# Patient Record
Sex: Female | Born: 1985 | Race: Black or African American | Hispanic: No | Marital: Single | Smoking: Never smoker
Health system: Southern US, Community
[De-identification: ages and names within clinical notes are randomized; demographics above are authoritative.]

## PROBLEM LIST (undated history)

## (undated) DIAGNOSIS — D649 Anemia, unspecified: Secondary | ICD-10-CM

## (undated) DIAGNOSIS — I10 Essential (primary) hypertension: Secondary | ICD-10-CM

## (undated) HISTORY — PX: WISDOM TOOTH EXTRACTION: SHX21

## (undated) HISTORY — DX: Anemia, unspecified: D64.9

## (undated) HISTORY — DX: Essential (primary) hypertension: I10

---
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
## 2006-08-17 ENCOUNTER — Emergency Department (HOSPITAL_COMMUNITY): Admission: EM | Admit: 2006-08-17 | Discharge: 2006-08-17 | Payer: Self-pay | Admitting: Emergency Medicine

## 2006-10-30 ENCOUNTER — Ambulatory Visit: Payer: Self-pay | Admitting: Family Medicine

## 2006-11-21 ENCOUNTER — Other Ambulatory Visit: Admission: RE | Admit: 2006-11-21 | Discharge: 2006-11-21 | Payer: Self-pay | Admitting: Obstetrics and Gynecology

## 2007-01-04 ENCOUNTER — Ambulatory Visit (HOSPITAL_COMMUNITY): Admission: RE | Admit: 2007-01-04 | Discharge: 2007-01-04 | Payer: Self-pay | Admitting: Obstetrics and Gynecology

## 2007-02-18 ENCOUNTER — Inpatient Hospital Stay (HOSPITAL_COMMUNITY): Admission: AD | Admit: 2007-02-18 | Discharge: 2007-02-18 | Payer: Self-pay | Admitting: Obstetrics and Gynecology

## 2007-04-25 ENCOUNTER — Inpatient Hospital Stay (HOSPITAL_COMMUNITY): Admission: AD | Admit: 2007-04-25 | Discharge: 2007-04-26 | Payer: Self-pay | Admitting: Obstetrics and Gynecology

## 2007-04-25 ENCOUNTER — Encounter: Payer: Self-pay | Admitting: Obstetrics and Gynecology

## 2007-05-12 ENCOUNTER — Inpatient Hospital Stay (HOSPITAL_COMMUNITY): Admission: AD | Admit: 2007-05-12 | Discharge: 2007-05-15 | Payer: Self-pay | Admitting: Obstetrics & Gynecology

## 2007-05-21 ENCOUNTER — Inpatient Hospital Stay (HOSPITAL_COMMUNITY): Admission: AD | Admit: 2007-05-21 | Discharge: 2007-05-23 | Payer: Self-pay | Admitting: Obstetrics and Gynecology

## 2007-06-24 ENCOUNTER — Other Ambulatory Visit: Admission: RE | Admit: 2007-06-24 | Discharge: 2007-06-24 | Payer: Self-pay | Admitting: Obstetrics and Gynecology

## 2007-10-17 ENCOUNTER — Emergency Department (HOSPITAL_COMMUNITY): Admission: EM | Admit: 2007-10-17 | Discharge: 2007-10-17 | Payer: Self-pay | Admitting: Emergency Medicine

## 2008-05-21 ENCOUNTER — Ambulatory Visit (HOSPITAL_COMMUNITY): Admission: RE | Admit: 2008-05-21 | Discharge: 2008-05-21 | Payer: Self-pay | Admitting: Family Medicine

## 2008-05-21 ENCOUNTER — Encounter: Payer: Self-pay | Admitting: Obstetrics and Gynecology

## 2008-05-21 ENCOUNTER — Ambulatory Visit: Payer: Self-pay | Admitting: Obstetrics and Gynecology

## 2009-03-31 ENCOUNTER — Emergency Department (HOSPITAL_COMMUNITY): Admission: EM | Admit: 2009-03-31 | Discharge: 2009-03-31 | Payer: Self-pay | Admitting: Family Medicine

## 2010-09-26 ENCOUNTER — Encounter (INDEPENDENT_AMBULATORY_CARE_PROVIDER_SITE_OTHER): Payer: Self-pay | Admitting: *Deleted

## 2010-09-26 ENCOUNTER — Ambulatory Visit: Payer: Self-pay | Admitting: Obstetrics and Gynecology

## 2010-09-26 LAB — CONVERTED CEMR LAB: Hepatitis B Surface Ag: NEGATIVE

## 2011-03-28 NOTE — H&P (Signed)
Sims, Molly              ACCOUNT NO.:  [id number]   MEDICAL RECORD NO.:  [id number]          PATIENT TYPE:  INP   LOCATION:  9399                          FACILITY:  [facility]   PHYSICIAN:  James A. Ashley Royalty, M.D.DATE OF BIRTH:  Jun 30, 1986   DATE OF ADMISSION:  05/21/2007  DATE OF DISCHARGE:                              HISTORY & PHYSICAL   HISTORY OF PRESENT ILLNESS:  This is a 25 year old gravida 1, para 1 who  delivered 05/13/07, vaginally, 8 days ago. She was evaluated by the baby  love  coordinator this morning and found to be slightly hypertensive  and febrile. She was asked to come into the office today for evaluation.  Of note, regarding the patient's  delivery is the fact that she  experienced premature rupture of the membranes at 10:00 p.m. the night  before she came in. She waited 14 hours to present to the hospital after  this event even though she had been given written instructions to call  immediately should she experience what she thought might be ruptured  membranes. On discharge she was also given explicit instructions to call  for any temperature elevations orally of 100.4 degrees Fahrenheit. Upon  arrival today she stated she had a temperature of 106 degrees Fahrenheit  last evening and did not call. In addition, she had a temperature of 104  degrees Fahrenheit this morning and did not call. She states her  bleeding is diminishing appropriately. She denies myalgias, arthralgias,  right upper quadrant or epigastric pain, visual disturbances, calf  tenderness, dysuria or sputum production. She does admit to having some  headaches and occasional dizziness as well.   MEDICATIONS:  1. Ibuprofen.  2. Percocet.  3. Chromagen.   PAST MEDICAL HISTORY:  Negative.   PAST SURGICAL HISTORY:  Negative.   ALLERGIES:  NONE.   FAMILY HISTORY:  Positive for hypertension, diabetes.   SOCIAL HISTORY:  The patient denies use of tobacco or extensive alcohol.   REVIEW OF  SYSTEMS:  Noncontributory.   PHYSICAL EXAMINATION:  GENERAL APPEARANCE: Well-developed, well-  nourished, obese, black female in no acute distress.  VITAL SIGNS: Temperature of 102.9 degrees Fahrenheit, blood pressure  140/88.  SKIN: The skin is warm and dry without lesions.  LYMPH NODES: There is no supraclavicular, cervical or inguinal  adenopathy.  HEENT: Normocephalic.  NECK: Supple without thyromegaly.  CHEST: Lungs are clear.  CARDIAC: Regular rate and rhythm.  BREASTS: The breasts are soft and without palpable mass or adenopathy  bilaterally.  ABDOMEN: The abdomen is soft and only minimally tender to frank  palpation. Bowel sounds are active.  MUSCULOSKELETAL: No CVA tenderness.  PELVIC: External genitalia within normal limits. Vagina and cervix  without gross lesions. Bimanual examination reveals the uterus to be  approximately 12 to 14 weeks' size and modest tenderness to direct  palpation noted. I could appreciate no adnexal masses.   IMPRESSION:  1. Status post vaginal delivery 05/13/07, eight days ago.  2. Noncompliance with obstetrical gynecological care.  3. Modest blood pressure elevation and headache -- rule out post      partum pre-eclampsia.  4. Fever -- differential includes endometritis. Doubt thromboembolic      phenomenon, pneumonitis, urinary tract infection, viral syndrome.   PLAN:  Will place on outpatient observation. Will check labs including  PIH panel, blood cultures, urine culture, urinalysis. After cultures  obtained will initiate IV antibiotics and other appropriate  interventions as indicated.      James A. Ashley Royalty, M.D.  Electronically Signed     JAM/MEDQ  D:  05/21/2007  T:  05/21/2007  Job:  [id number]

## 2011-03-28 NOTE — Group Therapy Note (Signed)
NAMERACQUELLE, HYSER NO.:  [id number]   MEDICAL RECORD NO.:  [id number]          PATIENT TYPE:  WOC   LOCATION:  [facility]                   FACILITY:  [facility]   PHYSICIAN:  Allie Bossier, MD        DATE OF BIRTH:  09-02-1986   DATE OF SERVICE:  05/21/2008                                  CLINIC NOTE   REASON FOR VISIT:  Abdominal pain and annual exam, and to evaluate  placement of IUD.   SUBJECTIVE:  Molly Sims is a 25 year old female who had a child  approximately one year ago and her last annual exam approximately one  year ago, who presents for a repeat of her annual exam,  and to evaluate  the placement of a Mirena.  She has been complaining of some abdominal  pain intermittently for the last several months, which she describes as  dull and periodic in her lower abdomen.  It is worse with having  intercourse.  She has not been checking placement of her IUD, and was  unsure how to do so.  She is unsure of her last period as she has only  spotted occasionally when having her IUD.  The patient started her  menstrual period at age 35 and is a G1, P1-0-0-1, and has a son.  She  had a Mirena placed for contraception and takes no other medicines other  than that.  She has no allergies either.  Her last PAP smear was  approximately one year ago.  She has never had an abnormal Pap smear.   FAMILY HISTORY:  Patient's grandmother, grandfather and aunts all have  diabetes.  Her grandmother also has high blood pressure.   SOCIAL HISTORY:  She does work at [employer].  She does  not smoke or drink alcohol.  She is currently married and her spouse is  with her today in this visit.   OBJECTIVE:  Temperature is 98.7, pulse 72, blood pressure 110/70, weight  275.8 pounds.  Respiratory rate is 20.  GENERAL:  This is a morbidly obese African American female in no  apparent distress, who is pleasant during her exam.  CARDIOVASCULAR:  Reveals a regular rate and  rhythm.  No murmurs, rubs or  gallops.  LUNGS:  Clear to auscultation bilaterally.  ABDOMEN:  Mildly tender to deep palpation in the suprapubic region.  Otherwise it is nontender and there is no hepatosplenomegaly  appreciated.  BREAST:  Exam normal bilaterally.  GU:  Exam reveals a normal physiologic vaginal discharge.  Normal  rugated vagina.  Cervix without any appreciable lesions on  visualization.  Pap smear was performed during this exam.  Bimanual exam  reveals a small palpable cyst at approximately 7 o'clock on the cervix,  which is nontender.  She has no cervical motion tenderness and there are  no adnexal masses appreciated.  IUD strings were not visualized nor  palpated during the exam.   ASSESSMENT:  This is a 25 year old female who presents for her annual  exam with some abdominal pain and concern about placement of her IUD.   PLAN:  1. Abdominal pain.  It is possible that this is related to her IUD.      Therefore, we will get an ultrasound to evaluate location of her      IUD.  She plans to use condoms at this point in time to protect      against pregnancy since her IUD was not able to be visualized.  We      do suspect that her IUD is likely in place.  2. Preventive care and health maintenance.  The patient did have her      annual Pap smear performed today and breast exam.  We will make      sure that she gets the results of this Pap smear once they return.     ______________________________  Ancil Boozer, ?    ______________________________  Allie Bossier, MD    SA/MEDQ  D:  05/21/2008  T:  05/21/2008  Job:  [id number]

## 2011-03-31 NOTE — Discharge Summary (Signed)
Molly Sims, Molly Sims              ACCOUNT NO.:  [id number]   MEDICAL RECORD NO.:  [id number]          PATIENT TYPE:  INP   LOCATION:  9118                          [facility]   PHYSICIAN:  James A. Ashley Royalty, M.D.DATE OF BIRTH:  [birth year]   DATE OF ADMISSION:  05/12/2007  DATE OF DISCHARGE:  05/15/2007                               DISCHARGE SUMMARY   DISCHARGE DIAGNOSES:  1. Intrauterine pregnancy at 37 weeks and 5 days gestation, delivered.  2. Premature rupture of membranes.  3. GBS carrier.  4. Noncompliance with OB/GYN care.  5. Term birth living child  6. Shoulder dystocia.  7. Anemia - asymptomatic.   CONSULTATIONS:  None.   DISCHARGE MEDICATIONS:  Percocet, Motrin 600 mg q.i.d. p.r.n.,  Chromagen, prenatal vitamins.   HISTORY AND PHYSICAL:  A 25 year old primigravida at 37 weeks and 5 days  gestation.  Prenatal care was essentially uncomplicated.  The patient  presented to maternity admissions complaining of ruptured membranes on  May 11, 2007.  She did not call.  Rupture of membranes was documented  in maternity admissions.  The cervical examination in maternity  admissions revealed 2-cm dilatation. For the remainder of the history  and physical please see chart.   HOSPITAL COURSE:  The patient was admitted to [facility] of  [place].  Initial laboratory studies were drawn.  She was given  antibiotic prophylaxis versus GBS.  She was given Pitocin intravenously.  The patient went on to labor and deliver on May 13, 2007.  The infant  was a 5 pound 14 ounce female, Apgar's 6 at 1 minute and 8 at 5 minutes,  sent to the newborn nursery.  Delivery was accomplished by Dr. Sylvester Harder over an intact perineum with the kiwi vacuum extractor  secondary to variable decelerations during delayed second stage.  There  was a shoulder dystocia which was relieved with McRoberts and suprapubic  pressure.  Delivery was accomplished over an intact perineum.  However,  with the emergence of the shoulder dystocia a second-degree midline  episiotomy was cut and later repaired without difficulty.   The patient's postpartum course was benign save for an asymptomatic  anemia.  The patient was felt to be stable for discharge on May 15, 2007  and was discharged home afebrile and in satisfactory condition.   DISPOSITION:  The patient is to return to [facility] and  Obstetrics in 6 weeks for postpartum evaluation.      James A. Ashley Royalty, M.D.  Electronically Signed     JAM/MEDQ  D:  07/18/2007  T:  07/18/2007  Job:  [id number]

## 2011-03-31 NOTE — Discharge Summary (Signed)
Molly Sims, Molly Sims              ACCOUNT NO.:  [id number]   MEDICAL RECORD NO.:  [id number]          PATIENT TYPE:  INP   LOCATION:  9315                          [facility]   PHYSICIAN:  James A. Ashley Royalty, M.D.DATE OF BIRTH:  05-27-1986   DATE OF ADMISSION:  05/21/2007  DATE OF DISCHARGE:  05/23/2007                               DISCHARGE SUMMARY   DISCHARGE DIAGNOSES:  1. Status post vaginal delivery May 13, 2007.  2. Noncompliance with obstetric/gynecologic care.  3. Postpartum endomyometritis--improved.   CONSULTATIONS:  None.   DISCHARGE MEDICATIONS:  1. Augmentin 875 mg b.i.d.  2. Other medications as previously prescribed at the time of the last      discharge.   HISTORY AND PHYSICAL:  This is a 25 year old gravida 1, para 1, status  post vaginal delivery May 13, 2007.  She was evaluated at home by the  [facility] on the morning of admission and found to be  slightly hypertensive and febrile.  She was asked to come to the office  for evaluation.  The patient's temperature was 102.9 degrees Fahrenheit,  blood pressure 140/88.  There was modest tenderness to direct palpation  of the uterus.  The initial impression was fever--differential including  endometritis, and so forth.  The patient was admitted for further workup  and antibiotics.  For the remainder of the history and physical, please  see chart.   HOSPITAL COURSE:  The patient was admitted to [facility] of  [place].  Admission laboratory studies were drawn.  Urine and blood  cultures were obtained as well as a PIH panel.  WBC revealed a value of  17.1 initially.  After IV antibiotics were instituted, it went down to  13.2 by May 22, 2007.  By May 23, 2007, the white blood count went down  farther to 9000.  The patient was afebrile for greater than 24 hours.  On May 23, 2007, she was felt to be stable for discharge with  presumptive diagnosis of endomyometritis-resolving.  She was  discharged  home on Augmentin 875 mg b.i.d. as well as the medications previously  given on discharge after her vaginal delivery.   DISPOSITION:  The patient is to return to the office in approximately  five weeks for postpartum appointment.  Appropriate discharge  instructions were given.      James A. Ashley Royalty, M.D.  Electronically Signed     JAM/MEDQ  D:  07/18/2007  T:  07/18/2007  Job:  [id number]

## 2011-06-03 ENCOUNTER — Emergency Department (HOSPITAL_COMMUNITY)
Admission: EM | Admit: 2011-06-03 | Discharge: 2011-06-03 | Disposition: A | Discharge status: Home / Self Care | Payer: Medicaid Other | Attending: Emergency Medicine | Admitting: Emergency Medicine

## 2011-06-03 DIAGNOSIS — S51809A Unspecified open wound of unspecified forearm, initial encounter: Secondary | ICD-10-CM | POA: Insufficient documentation

## 2011-06-03 DIAGNOSIS — S01501A Unspecified open wound of lip, initial encounter: Secondary | ICD-10-CM | POA: Insufficient documentation

## 2011-06-03 DIAGNOSIS — Y92009 Unspecified place in unspecified non-institutional (private) residence as the place of occurrence of the external cause: Secondary | ICD-10-CM | POA: Insufficient documentation

## 2011-06-03 DIAGNOSIS — S61209A Unspecified open wound of unspecified finger without damage to nail, initial encounter: Secondary | ICD-10-CM | POA: Insufficient documentation

## 2011-06-06 ENCOUNTER — Inpatient Hospital Stay (INDEPENDENT_AMBULATORY_CARE_PROVIDER_SITE_OTHER)
Admission: RE | Admit: 2011-06-06 | Discharge: 2011-06-06 | Disposition: A | Discharge status: Home / Self Care | Payer: Medicaid Other | Source: Ambulatory Visit | Attending: Emergency Medicine | Admitting: Emergency Medicine

## 2011-06-06 DIAGNOSIS — S01501A Unspecified open wound of lip, initial encounter: Secondary | ICD-10-CM

## 2011-06-10 ENCOUNTER — Inpatient Hospital Stay (INDEPENDENT_AMBULATORY_CARE_PROVIDER_SITE_OTHER)
Admission: RE | Admit: 2011-06-10 | Discharge: 2011-06-10 | Disposition: A | Discharge status: Home / Self Care | Payer: Medicaid Other | Source: Ambulatory Visit | Attending: Family Medicine | Admitting: Family Medicine

## 2011-06-10 DIAGNOSIS — Z4802 Encounter for removal of sutures: Secondary | ICD-10-CM

## 2011-08-29 LAB — CBC
HCT: 32.1 — ABNORMAL LOW
Hemoglobin: 10.6 — ABNORMAL LOW
MCHC: 32.6
MCHC: 32.6
MCHC: 32.8
MCV: 87.4
MCV: 88.3
Platelets: 273
Platelets: 340
Platelets: 361
RBC: 3.26 — ABNORMAL LOW
RBC: 3.66 — ABNORMAL LOW
RBC: 3.73 — ABNORMAL LOW
RDW: 14.4 — ABNORMAL HIGH
RDW: 14.7 — ABNORMAL HIGH
RDW: 14.7 — ABNORMAL HIGH
WBC: 13.2 — ABNORMAL HIGH
WBC: 17.1 — ABNORMAL HIGH

## 2011-08-29 LAB — URINE CULTURE: Special Requests: NEGATIVE

## 2011-08-29 LAB — URINALYSIS, ROUTINE W REFLEX MICROSCOPIC
Bilirubin Urine: NEGATIVE
Glucose, UA: NEGATIVE
Ketones, ur: 15 — AB
Protein, ur: NEGATIVE
Urobilinogen, UA: 0.2

## 2011-08-29 LAB — COMPREHENSIVE METABOLIC PANEL
Albumin: 2.8 — ABNORMAL LOW
BUN: 7
CO2: 23
GFR calc non Af Amer: >60
Glucose, Bld: 125 — ABNORMAL HIGH
Sodium: 135
Total Bilirubin: 0.4

## 2011-08-29 LAB — CULTURE, BLOOD (ROUTINE X 2)

## 2011-08-29 LAB — URIC ACID: Uric Acid, Serum: 6.4

## 2011-08-29 LAB — URINE MICROSCOPIC-ADD ON

## 2011-08-30 LAB — CBC
Hemoglobin: 11.1 — ABNORMAL LOW
MCHC: 33.1
MCV: 86.3
Platelets: 341
WBC: 13.6 — ABNORMAL HIGH

## 2011-08-30 LAB — RPR: RPR Ser Ql: NONREACTIVE

## 2011-08-31 LAB — DIFFERENTIAL
Eosinophils Absolute: 0.1
Lymphocytes Relative: 25
Neutro Abs: 8.3 — ABNORMAL HIGH
Neutrophils Relative %: 67

## 2011-08-31 LAB — I-STAT 8, (EC8 V) (CONVERTED LAB)
Acid-base deficit: 4 — ABNORMAL HIGH
BUN: 4 — ABNORMAL LOW
Bicarbonate: 19.9 — ABNORMAL LOW
Chloride: 110
HCT: 37
Hemoglobin: 12.6
Operator id: 146091
Sodium: 140

## 2011-08-31 LAB — POCT I-STAT CREATININE
Creatinine, Ser: 0.6
Operator id: 146091

## 2011-08-31 LAB — CBC
MCV: 87.1
RDW: 14.5 — ABNORMAL HIGH

## 2011-08-31 LAB — KLEIHAUER-BETKE STAIN: Fetal Cells %: 0

## 2012-01-10 ENCOUNTER — Encounter (HOSPITAL_COMMUNITY): Payer: Self-pay | Admitting: Emergency Medicine

## 2012-01-10 ENCOUNTER — Emergency Department (HOSPITAL_COMMUNITY)
Admission: EM | Admit: 2012-01-10 | Discharge: 2012-01-11 | Disposition: A | Discharge status: Home / Self Care | Payer: Medicaid Other | Attending: Emergency Medicine | Admitting: Emergency Medicine

## 2012-01-10 DIAGNOSIS — M25469 Effusion, unspecified knee: Secondary | ICD-10-CM | POA: Insufficient documentation

## 2012-01-10 DIAGNOSIS — L02419 Cutaneous abscess of limb, unspecified: Secondary | ICD-10-CM | POA: Insufficient documentation

## 2012-01-10 DIAGNOSIS — L03119 Cellulitis of unspecified part of limb: Secondary | ICD-10-CM

## 2012-01-10 DIAGNOSIS — M7989 Other specified soft tissue disorders: Secondary | ICD-10-CM | POA: Insufficient documentation

## 2012-01-10 DIAGNOSIS — M79609 Pain in unspecified limb: Secondary | ICD-10-CM | POA: Insufficient documentation

## 2012-01-10 MED ORDER — OXYCODONE-ACETAMINOPHEN 5-325 MG PO TABS
1.0000 | ORAL_TABLET | Freq: Once | ORAL | Status: AC
  Administered 2012-01-11: 1 via ORAL
  Filled 2012-01-10: qty 1

## 2012-01-10 NOTE — ED Notes (Signed)
Patient with left leg pain from knee down.  Patient denies any injury to the leg.  She has pain when walking or bending the knee.

## 2012-01-10 NOTE — ED Provider Notes (Signed)
History     CSN: [id number]  Arrival date & time 01/10/12  2016   First MD Initiated Contact with Patient 01/10/12 2337      Chief Complaint  Patient presents with  . Leg Pain    (Consider location/radiation/quality/duration/timing/severity/associated sxs/prior treatment) HPI Comments: 2 days ago, while walking patient developed left anterior medial calf pain with swelling without injury.  It has been persistent since and very painful for ambulation.  There is no discoloration of the skin.  She does shave her legs, but there are no abrasions or breaks in the skin.  This and has not had fever, myalgias  Patient is a 26 y.o. female presenting with leg pain.  Leg Pain  The incident occurred 2 days ago. The incident occurred at home. There was no injury mechanism.    History reviewed. No pertinent past medical history.  History reviewed. No pertinent past surgical history.  History reviewed. No pertinent family history.  History  Substance Use Topics  . Smoking status: Never Smoker   . Smokeless tobacco: Not on file  . Alcohol Use: Yes    OB History    Grav Para Term Preterm Abortions TAB SAB Ect Mult Living                  Review of Systems  Constitutional: Negative for fever.  Respiratory: Negative for shortness of breath.   Cardiovascular: Positive for leg swelling.  Skin: Negative for rash and wound.  Neurological: Negative for dizziness.    Allergies  Review of patient's allergies indicates no known allergies.  Home Medications   Current Outpatient Rx  Name Route Sig Dispense Refill  . CEPHALEXIN 250 MG PO CAPS Oral Take 1 capsule (250 mg total) by mouth 4 (four) times daily. 28 capsule 0  . DOXYCYCLINE HYCLATE 100 MG PO CAPS Oral Take 1 capsule (100 mg total) by mouth 2 (two) times daily. 20 capsule 0  . HYDROCODONE-ACETAMINOPHEN 5-325 MG PO TABS Oral Take 2 tablets by mouth every 6 (six) hours as needed for pain. 30 tablet 0    BP 135/90  Pulse 103   Temp(Src) 98.8 F (37.1 C) (Oral)  Resp 18  Ht 5\' 2"  (1.575 m)  Wt 270 lb (122.471 kg)  BMI 49.38 kg/m2  SpO2 100%  LMP 12/24/2011  Physical Exam  Constitutional: She is oriented to person, place, and time. She appears well-developed and well-nourished.  HENT:  Head: Normocephalic.  Eyes: Pupils are equal, round, and reactive to light.  Neck: Normal range of motion.  Cardiovascular: Normal rate.   Pulmonary/Chest: Effort normal.  Musculoskeletal: She exhibits edema and tenderness.       Large area of greater than 10 cm, oval, medial left calf, just distal to the knee that is slightly fluctuant, not discolored, extremely painful.  Vision has pain in the joint with movement pain with ambulation  Neurological: She is oriented to person, place, and time.  Skin: Skin is warm and dry. No rash noted. No erythema.    ED Course  Procedures (including critical care time)  Labs Reviewed  CBC - Abnormal; Notable for the following:    WBC 11.3 (*)    Hemoglobin 11.4 (*)    HCT 35.6 (*)    All other components within normal limits  DIFFERENTIAL   Ct Tibia Fibula Left W Contrast  01/11/2012  *RADIOLOGY REPORT*  Clinical Data: Left leg pain, extending inferiorly from the knee. Concern for soft tissue mass along the left lower  leg.  Pain when ambulating or flexing the knee.  CT OF THE LEFT TIBIA AND FIBULA WITH CONTRAST  Contrast: OMNIPAQUE IOHEXOL 300 MG/ML IJ SOLN  Comparison: Left lower extremity ultrasound performed earlier today at 01:31 a.m.  Findings: No focal soft tissue mass is identified.  No significant soft tissue abnormality is seen in the area of clinical concern. Minimal soft tissue edema is noted along the lateral aspect of the left lower leg, and anterior to the tibia.  The visualized soft tissues are otherwise grossly unremarkable.  The visualized musculature is within normal limits; the overlying fascia is unremarkable.  There is no evidence of vascular compromise.  No  focal soft tissue hematoma is seen.  At the left knee, there is a small knee joint effusion.  There is mild medial compartment narrowing, without significant marginal osteophyte formation.  The medial and lateral menisci appear grossly intact, though difficult to fully characterize on CT.  The anterior cruciate ligament also appears intact.  There is apparent diffuse inflammation involving the posterior cruciate ligament, with mild calcification noted along the distal aspect of the ligament.  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  However, the ligament remains grossly intact. There is also mild soft tissue edema involving the distal aspect of the iliotibial band; the lateral collateral ligament complex remains intact.  The medial collateral ligament is grossly unremarkable in appearance.  The ankle mortise is grossly unremarkable in appearance.  The visualized flexor and extensor tendons along the foot are grossly unremarkable.  The Achilles tendon remains intact.  IMPRESSION:  1.  No focal soft tissue mass seen. 2.  Minimal soft tissue edema noted along the anterior and lateral aspects of the left lower leg.  No focal fluid collection seen. 3.  Small left knee joint effusion noted. 4.  Mild medial compartment narrowing noted at the left knee, without additional evidence of degenerative change. 5.  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 6.  Mild soft tissue edema involving the distal aspect of the iliotibial band, nonspecific in appearance.  The lateral collateral ligament complex remains grossly intact.  Original Report Authenticated By: Tonia Ghent, M.D.   [place] Extrem Low Left Comp  01/11/2012  *RADIOLOGY REPORT*  Clinical Data: Evaluate the left calf for a fluid collection. Generalized left calf pain.  ULTRASOUND  LEFT LOWER EXTREMITY LIMITED  Technique:  Ultrasound examination of the region of interest in the left lower extremity was performed.  Comparison:  None.  Findings: The left calf was evaluated with ultrasound.  There appears to be generalized subcutaneous edema.  There are no focal fluid collections or suspicious solid lesions.  IMPRESSION: Evidence for subcutaneous edema in the left calf.  No evidence for a focal fluid collection.  Original Report Authenticated By: Richarda Overlie, M.D.     1. Cellulitis of leg without foot       MDM  Will obtain a bedside ultrasound to evaluate for hematoma versus abscess        Arman Filter, NP 01/10/12 2344  Arman Filter, NP 01/11/12 1610  Arman Filter, NP 01/11/12 [phone]

## 2012-01-10 NOTE — ED Notes (Signed)
Reports onset of pain to left knee rad into left calf area; states pain present 24/7; however, worse with weight bearing; small amount of swelling noted to dorsal aspect of left calf area - tender to touch; pt morbidly obese; hence, difficult to assess degree of swelling; strong palpable pedal pulse palpable

## 2012-01-11 ENCOUNTER — Emergency Department (HOSPITAL_COMMUNITY): Payer: Medicaid Other

## 2012-01-11 ENCOUNTER — Encounter (HOSPITAL_COMMUNITY): Payer: Self-pay | Admitting: Radiology

## 2012-01-11 LAB — POCT I-STAT, CHEM 8
BUN: 14 mg/dL (ref 6–23)
Calcium, Ion: 1.14 mmol/L (ref 1.12–1.32)
Chloride: 107 mEq/L (ref 96–112)
Creatinine, Ser: 0.7 mg/dL (ref 0.50–1.10)

## 2012-01-11 LAB — DIFFERENTIAL
Basophils Absolute: 0 10*3/uL (ref 0.0–0.1)
Monocytes Absolute: 0.7 10*3/uL (ref 0.1–1.0)
Neutrophils Relative %: 62 % (ref 43–77)

## 2012-01-11 LAB — CBC
MCH: 27.9 pg (ref 26.0–34.0)
MCHC: 32 g/dL (ref 30.0–36.0)
MCV: 87.3 fL (ref 78.0–100.0)
Platelets: 304 10*3/uL (ref 150–400)

## 2012-01-11 MED ORDER — CEPHALEXIN 250 MG PO CAPS
250.0000 mg | ORAL_CAPSULE | Freq: Once | ORAL | Status: AC
  Administered 2012-01-11: 250 mg via ORAL
  Filled 2012-01-11: qty 1

## 2012-01-11 MED ORDER — HYDROCODONE-ACETAMINOPHEN 5-325 MG PO TABS: 2.0000 | ORAL_TABLET | Freq: Four times a day (QID) | ORAL | Status: AC | PRN

## 2012-01-11 MED ORDER — IOHEXOL 300 MG/ML  SOLN
100.0000 mL | Freq: Once | INTRAMUSCULAR | Status: AC | PRN
  Administered 2012-01-11: 100 mL via INTRAVENOUS

## 2012-01-11 MED ORDER — CEPHALEXIN 250 MG PO CAPS: 250.0000 mg | ORAL_CAPSULE | Freq: Four times a day (QID) | ORAL | Status: AC

## 2012-01-11 MED ORDER — DOXYCYCLINE HYCLATE 100 MG PO TABS
100.0000 mg | ORAL_TABLET | Freq: Once | ORAL | Status: AC
  Administered 2012-01-11: 100 mg via ORAL
  Filled 2012-01-11: qty 1

## 2012-01-11 MED ORDER — DOXYCYCLINE HYCLATE 100 MG PO CAPS: 100.0000 mg | ORAL_CAPSULE | Freq: Two times a day (BID) | ORAL | Status: AC

## 2012-01-11 NOTE — ED Provider Notes (Signed)
Medical screening examination/treatment/procedure(s) were performed by non-physician practitioner and as supervising physician I was immediately available for consultation/collaboration.   Laray Anger, DO 01/11/12 1626

## 2012-01-11 NOTE — Discharge Instructions (Signed)
Cellulitis Cellulitis is an infection of the tissue under the skin. The infected area is usually red and tender. This is caused by germs. These germs enter the body through cuts or sores. This usually happens in the arms or lower legs. HOME CARE   Take your medicine as told. Finish it even if you start to feel better.   If the infection is on the arm or leg, keep it raised (elevated).   Use a warm cloth on the infected area several times a day.   See your doctor for a follow-up visit as told.  GET HELP RIGHT AWAY IF:   You are tired or confused.   You throw up (vomit).   You have watery poop (diarrhea).   You feel ill and have muscle aches.   You have a fever.  MAKE SURE YOU:   Understand these instructions.   Will watch your condition.   Will get help right away if you are not doing well or get worse.  Document Released: 04/17/2008 Document Revised: 07/12/2011 Document Reviewed: 10/01/2009 [facility] Patient Information 2012 [place], [place]. Major studies reveal her to have a cellulitis of your left lower leg.  It and is prescribed 2 different antibiotics.  Please take these together until completed.  We've also been given a prescription for pain medication.  I gave you can use as needed if you need more pain control and ibuprofen or Advil.  Please return to the emergency room in 2 days for reevaluation, sooner if you not getting better

## 2012-01-11 NOTE — ED Notes (Signed)
Patient transported to CT 

## 2012-01-16 ENCOUNTER — Emergency Department (HOSPITAL_COMMUNITY)
Admission: EM | Admit: 2012-01-16 | Discharge: 2012-01-16 | Disposition: A | Discharge status: Home / Self Care | Payer: Medicaid Other

## 2012-05-20 ENCOUNTER — Encounter: Payer: Self-pay | Admitting: Obstetrics & Gynecology

## 2012-05-20 ENCOUNTER — Ambulatory Visit (INDEPENDENT_AMBULATORY_CARE_PROVIDER_SITE_OTHER): Payer: Medicaid Other | Admitting: Obstetrics & Gynecology

## 2012-05-20 VITALS — BP 127/82 | HR 80 | Temp 98.6°F | Ht 62.0 in | Wt 274.0 lb

## 2012-05-20 DIAGNOSIS — Z30431 Encounter for routine checking of intrauterine contraceptive device: Secondary | ICD-10-CM

## 2012-05-20 NOTE — Patient Instructions (Signed)
Return to clinic for any scheduled appointments or for any gynecologic concerns as needed.   

## 2012-05-20 NOTE — Progress Notes (Signed)
Patient here today for Mirena IUD removal and reinsertion. IUD placed in August 2008.  Unable to verify Medicaid status because system is down; unable to do procedure today.  Will reschedule procedure in the next few days/weeks. Patient agrees with this plan.

## 2012-06-12 ENCOUNTER — Ambulatory Visit: Payer: Medicaid Other | Admitting: Obstetrics & Gynecology

## 2013-01-05 ENCOUNTER — Encounter (HOSPITAL_COMMUNITY): Payer: Self-pay | Admitting: *Deleted

## 2013-01-05 ENCOUNTER — Emergency Department (INDEPENDENT_AMBULATORY_CARE_PROVIDER_SITE_OTHER)
Admission: EM | Admit: 2013-01-05 | Discharge: 2013-01-05 | Disposition: A | Discharge status: Home / Self Care | Payer: Self-pay | Source: Home / Self Care | Attending: Emergency Medicine | Admitting: Emergency Medicine

## 2013-01-05 DIAGNOSIS — K0401 Reversible pulpitis: Secondary | ICD-10-CM

## 2013-01-05 MED ORDER — IBUPROFEN 800 MG PO TABS
800.0000 mg | ORAL_TABLET | Freq: Once | ORAL | Status: AC
  Administered 2013-01-05: 800 mg via ORAL

## 2013-01-05 MED ORDER — IBUPROFEN 800 MG PO TABS
ORAL_TABLET | ORAL | Status: AC
  Filled 2013-01-05: qty 1

## 2013-01-05 MED ORDER — NAPROXEN 500 MG PO TABS: 500.0000 mg | ORAL_TABLET | Freq: Two times a day (BID) | ORAL | Status: DC

## 2013-01-05 MED ORDER — HYDROCODONE-ACETAMINOPHEN 5-325 MG PO TABS: ORAL_TABLET | ORAL | Status: DC

## 2013-01-05 MED ORDER — HYDROCODONE-ACETAMINOPHEN 5-325 MG PO TABS
1.0000 | ORAL_TABLET | Freq: Once | ORAL | Status: AC
Start: 2013-01-05 — End: 2013-01-05
  Administered 2013-01-05: 1 via ORAL

## 2013-01-05 MED ORDER — HYDROCODONE-ACETAMINOPHEN 5-325 MG PO TABS
ORAL_TABLET | ORAL | Status: AC
  Filled 2013-01-05: qty 1

## 2013-01-05 MED ORDER — PENICILLIN V POTASSIUM 500 MG PO TABS: 500.0000 mg | ORAL_TABLET | Freq: Four times a day (QID) | ORAL | Status: DC

## 2013-01-05 NOTE — ED Provider Notes (Signed)
Chief Complaint  Patient presents with  . Dental Pain    History of Present Illness:   Molly Sims is a 27 year old female who has had a four-day history of a toothache involving her left, lower second molar. There is some swelling around this area but no purulent drainage. She has pain with opening her mouth and chewing on that side but no difficulty swallowing or breathing. She's had some headache but denies any fever, chills, neck pain or swelling, shortness of breath, or chest pain.  Review of Systems:  Other than noted above, the patient denies any of the following symptoms: Systemic:  No fever, chills, sweats or weight loss. ENT:  No headache, ear ache, sore throat, nasal congestion, facial pain, or swelling. Lymphatic:  No adenopathy. Lungs:  No coughing, wheezing or shortness of breath.  PMFSH:  Past medical history, family history, social history, meds, and allergies were reviewed.  Physical Exam:   Vital signs:  BP 116/80  Pulse 81  Temp(Src) 99.1 F (37.3 C) (Oral)  Resp 20  SpO2 99%  LMP 12/03/2012 General:  Alert, oriented, in no distress. ENT:  TMs and canals normal.  Nasal mucosa normal. Mouth exam:  She has a small cavity in her left, lower second molar. There is no swelling of the gingiva and no pus collection. The pharynx Neck:  No swelling or adenopathy. Lungs:  Breath sounds clear and equal bilaterally.  No wheezes, rales or rhonchi. Heart:  Regular rhythm.  No gallops or murmers. Skin:  Clear, warm and dry.  Assessment:  The encounter diagnosis was Pulpitis.  Plan:   1.  The following meds were prescribed:   Discharge Medication List as of 01/05/2013  1:32 PM    START taking these medications   Details  HYDROcodone-acetaminophen (NORCO/VICODIN) 5-325 MG per tablet 1 to 2 tabs every 4 to 6 hours as needed for pain., Print    naproxen (NAPROSYN) 500 MG tablet Take 1 tablet (500 mg total) by mouth 2 (two) times daily., Starting 01/05/2013, Until  Discontinued, Normal    penicillin v potassium (VEETID) 500 MG tablet Take 1 tablet (500 mg total) by mouth 4 (four) times daily., Starting 01/05/2013, Until Discontinued, Normal       2.  The patient was instructed in symptomatic care and handouts were given. 3.  The patient was told to return if becoming worse in any way, if no better in 3 or 4 days, and given some red flag symptoms that would indicate earlier return, especially difficulty breathing. 4.  The patient was told to follow up with a dentist as soon as possible.    Reuben Likes, MD 01/05/13 [phone]

## 2013-01-05 NOTE — ED Notes (Signed)
Holding patient discharge  to assess reaction to medication.

## 2013-01-05 NOTE — ED Notes (Signed)
Patient complains of left lower molar pain with fever/chills and swelling.

## 2013-02-04 ENCOUNTER — Encounter (HOSPITAL_COMMUNITY): Payer: Self-pay | Admitting: *Deleted

## 2013-02-04 ENCOUNTER — Emergency Department (INDEPENDENT_AMBULATORY_CARE_PROVIDER_SITE_OTHER)
Admission: EM | Admit: 2013-02-04 | Discharge: 2013-02-04 | Disposition: A | Discharge status: Home / Self Care | Payer: Self-pay | Source: Home / Self Care | Attending: Family Medicine | Admitting: Family Medicine

## 2013-02-04 DIAGNOSIS — K029 Dental caries, unspecified: Secondary | ICD-10-CM

## 2013-02-04 MED ORDER — CLINDAMYCIN HCL 150 MG PO CAPS: 150.0000 mg | ORAL_CAPSULE | Freq: Four times a day (QID) | ORAL | Status: DC

## 2013-02-04 MED ORDER — DICLOFENAC POTASSIUM 50 MG PO TABS: 50.0000 mg | ORAL_TABLET | Freq: Three times a day (TID) | ORAL | Status: DC

## 2013-02-04 NOTE — ED Provider Notes (Addendum)
History     CSN: [id number]  Arrival date & time 02/04/13  1603   First MD Initiated Contact with Patient 02/04/13 1626      Chief Complaint  Patient presents with  . Dental Pain    (Consider location/radiation/quality/duration/timing/severity/associated sxs/prior treatment) Patient is a 27 y.o. female presenting with tooth pain. The history is provided by the patient.  Dental PainThe primary symptoms include mouth pain. The symptoms began 3 to 5 days ago (seen prev for same but did not fonish pen and sx relapsed 3 d ago.). The symptoms are worsening.  Additional symptoms include: gum tenderness, purulent gums and facial swelling. Additional symptoms do not include: trouble swallowing and pain with swallowing.    History reviewed. No pertinent past medical history.  Past Surgical History  Procedure Laterality Date  . Wisdom tooth extraction      History reviewed. No pertinent family history.  History  Substance Use Topics  . Smoking status: Never Smoker   . Smokeless tobacco: Not on file  . Alcohol Use: No    OB History   Grav Para Term Preterm Abortions TAB SAB Ect Mult Living                  Review of Systems  HENT: Positive for facial swelling and dental problem. Negative for trouble swallowing.     Allergies  Review of patient's allergies indicates no known allergies.  Home Medications   Current Outpatient Rx  Name  Route  Sig  Dispense  Refill  . naproxen (NAPROSYN) 500 MG tablet   Oral   Take 1 tablet (500 mg total) by mouth 2 (two) times daily.   30 tablet   0   . clindamycin (CLEOCIN) 150 MG capsule   Oral   Take 1 capsule (150 mg total) by mouth 4 (four) times daily.   28 capsule   0   . diclofenac (CATAFLAM) 50 MG tablet   Oral   Take 1 tablet (50 mg total) by mouth 3 (three) times daily. For dental pain   15 tablet   0   . HYDROcodone-acetaminophen (NORCO/VICODIN) 5-325 MG per tablet      1 to 2 tabs every 4 to 6 hours as needed for  pain.   20 tablet   0   . penicillin v potassium (VEETID) 500 MG tablet   Oral   Take 1 tablet (500 mg total) by mouth 4 (four) times daily.   40 tablet   0     BP 110/74  Pulse 81  Temp(Src) 98.7 F (37.1 C) (Oral)  Resp 18  SpO2 100%  LMP 01/31/2013  Physical Exam  Nursing note and vitals reviewed. Constitutional: She appears well-developed and well-nourished.  HENT:  Head: Normocephalic.  Right Ear: External ear normal.  Left Ear: External ear normal.  Mouth/Throat: Uvula is midline and oropharynx is clear and moist. Oral lesions present. Abnormal dentition. Dental abscesses present.    Neck: Normal range of motion. Neck supple.  Lymphadenopathy:    She has no cervical adenopathy.  Skin: Skin is warm and dry.    ED Course  Procedures (including critical care time)  Labs Reviewed - No data to display No results found.   1. Pain due to dental caries       MDM  Discussed with dr Oswaldo Done who stated no oral surg on call and he was not comfortable draining an abscess and gave names of oral surgeon for referral.  Linna Hoff, MD 02/04/13 1712  Linna Hoff, MD 02/04/13 1717  Linna Hoff, MD 02/05/13 2035

## 2013-02-04 NOTE — ED Notes (Signed)
C/o pain L lower jaw onset 3 days ago.  States it swelled up last night.  No dentist.

## 2014-01-03 ENCOUNTER — Emergency Department (HOSPITAL_COMMUNITY)
Admission: EM | Admit: 2014-01-03 | Discharge: 2014-01-03 | Disposition: A | Discharge status: Home / Self Care | Payer: Medicaid Other | Attending: Emergency Medicine | Admitting: Emergency Medicine

## 2014-01-03 ENCOUNTER — Encounter (HOSPITAL_COMMUNITY): Payer: Self-pay | Admitting: Emergency Medicine

## 2014-01-03 DIAGNOSIS — J029 Acute pharyngitis, unspecified: Secondary | ICD-10-CM | POA: Insufficient documentation

## 2014-01-03 DIAGNOSIS — IMO0001 Reserved for inherently not codable concepts without codable children: Secondary | ICD-10-CM | POA: Insufficient documentation

## 2014-01-03 DIAGNOSIS — R05 Cough: Secondary | ICD-10-CM | POA: Insufficient documentation

## 2014-01-03 DIAGNOSIS — R059 Cough, unspecified: Secondary | ICD-10-CM | POA: Insufficient documentation

## 2014-01-03 DIAGNOSIS — M5412 Radiculopathy, cervical region: Secondary | ICD-10-CM

## 2014-01-03 DIAGNOSIS — Z79899 Other long term (current) drug therapy: Secondary | ICD-10-CM | POA: Insufficient documentation

## 2014-01-03 MED ORDER — PREDNISONE 20 MG PO TABS: 40.0000 mg | ORAL_TABLET | Freq: Every day | ORAL | Status: DC

## 2014-01-03 MED ORDER — GUAIFENESIN 100 MG/5ML PO LIQD: 100.0000 mg | ORAL | Status: DC | PRN

## 2014-01-03 NOTE — ED Provider Notes (Signed)
CSN: [id number]     Arrival date & time 01/03/14  1703 History   This chart was scribed for non-physician practitioner, Fuller Canadaobert Cassidey Barrales-PA, working with Hurman HornJohn M Bednar, MD by Smiley HousemanFallon Davis, ED Scribe. This patient was seen in room TR08C/TR08C and the patient's care was started at 8:45 PM.  Chief Complaint  Patient presents with  . Generalized Body Aches    The history is provided by the patient. No language interpreter was used.   HPI Comments: Molly Sims is a 28 y.o. female who presents to the Emergency Department complaining of worsening constant generalized body aches that started a couple of months ago.  Pt states the myalgias usually subside, but they haven't subsided so she decided to come to the ED.  She states the aches are mainly in the right side of her back and radiate to her right arm.  She reports the pain is worse in her right arm with abduction.  She also reports muscle aches in her chest.  She states she doesn't remember sleeping on her arm incorrectly, because she sleeps on her stomach.  Pt denies any recent injuries, falls, or car wrecks.  Pt also complains of a sore throat and slight cough that started a couple of days ago.  Pt is unsure if they are related.  Pt does not have a PCP.  She denies diagnosis of diabetes.     History reviewed. No pertinent past medical history. Past Surgical History  Procedure Laterality Date  . Wisdom tooth extraction     History reviewed. No pertinent family history. History  Substance Use Topics  . Smoking status: Never Smoker   . Smokeless tobacco: Not on file  . Alcohol Use: No   OB History   Grav Para Term Preterm Abortions TAB SAB Ect Mult Living                 Review of Systems  Constitutional: Negative for fever and chills.  HENT: Positive for sore throat. Negative for congestion.   Respiratory: Positive for cough.   Gastrointestinal: Negative for nausea, vomiting, abdominal pain and diarrhea.  Musculoskeletal: Positive  for back pain and myalgias. Negative for gait problem.  Skin: Negative for color change and rash.  Neurological: Negative for headaches.  Psychiatric/Behavioral: Negative for behavioral problems and confusion.  All other systems reviewed and are negative.   Allergies  Review of patient's allergies indicates no known allergies.  Home Medications   Current Outpatient Rx  Name  Route  Sig  Dispense  Refill  . ibuprofen (ADVIL,MOTRIN) 200 MG tablet   Oral   Take 400 mg by mouth every 6 (six) hours as needed for moderate pain.         Marland Kitchen. levonorgestrel (MIRENA) 20 MCG/24HR IUD   Intrauterine   1 each by Intrauterine route once.          Triage Vitals: BP 147/93  Pulse 86  Temp(Src) 98.6 F (37 C) (Oral)  Resp 18  Ht 5\' 2"  (1.575 m)  Wt 277 lb 9.6 oz (125.919 kg)  BMI 50.76 kg/m2  SpO2 98%  Physical Exam  Nursing note and vitals reviewed. Constitutional: She is oriented to person, place, and time. She appears well-developed and well-nourished. No distress.  HENT:  Head: Normocephalic and atraumatic.  Eyes: Conjunctivae and EOM are normal. Right eye exhibits no discharge. Left eye exhibits no discharge. No scleral icterus.  Neck: Normal range of motion. Neck supple. No tracheal deviation present.  Cardiovascular: Normal  rate, regular rhythm and normal heart sounds.  Exam reveals no gallop and no friction rub.   No murmur heard. Pulmonary/Chest: Effort normal and breath sounds normal. No respiratory distress. She has no wheezes. She has no rales. She exhibits no tenderness.  Abdominal: Soft. She exhibits no distension. There is no tenderness.  Musculoskeletal: Normal range of motion.  Cervical paraspinal muscles tender to palpation, no bony tenderness, step-offs, or gross abnormality or deformity of spine, patient is able to ambulate, moves all extremities   Neurological: She is alert and oriented to person, place, and time.  Sensation and strength intact bilaterally    Skin: Skin is warm and dry. She is not diaphoretic.  Psychiatric: She has a normal mood and affect. Her behavior is normal. Judgment and thought content normal.    ED Course  Procedures (including critical care time) DIAGNOSTIC STUDIES: Oxygen Saturation is 98% on RA, normal by my interpretation.    COORDINATION OF CARE: 8:53 PM-Will discharge with prednisone and robitussin.  Patient informed of current plan of treatment and evaluation and agrees with plan.    MDM   Final diagnoses:  Cervical radicular pain   I discussed cervical radiculopathy with patient, and instructed her to followup with orthopedics, or of the spine surgeon. Will try her on a short course of prednisone, but made it clear to the patient she will need to have followup so this is not worsened. She did not have any neurovascular deficits at this time. There is no indication for further imaging or evaluation at this time.  I personally performed the services described in this documentation, which was scribed in my presence. The recorded information has been reviewed and is accurate.     Roxy Horseman, PA-C 01/04/14 0003

## 2014-01-03 NOTE — Discharge Instructions (Signed)
Cervical Radiculopathy  Cervical radiculopathy happens when a nerve in the neck is pinched or bruised by a slipped (herniated) disk or by arthritic changes in the bones of the cervical spine. This can occur due to an injury or as part of the normal aging process. Pressure on the cervical nerves can cause pain or numbness that runs from your neck all the way down into your arm and fingers.  CAUSES   There are many possible causes, including:  · Injury.  · Muscle tightness in the neck from overuse.  · Swollen, painful joints (arthritis).  · Breakdown or degeneration in the bones and joints of the spine (spondylosis) due to aging.  · Bone spurs that may develop near the cervical nerves.  SYMPTOMS   Symptoms include pain, weakness, or numbness in the affected arm and hand. Pain can be severe or irritating. Symptoms may be worse when extending or turning the neck.  DIAGNOSIS   Your caregiver will ask about your symptoms and do a physical exam. He or she may test your strength and reflexes. X-rays, CT scans, and MRI scans may be needed in cases of injury or if the symptoms do not go away after a period of time. Electromyography (EMG) or nerve conduction testing may be done to study how your nerves and muscles are working.  TREATMENT   Your caregiver may recommend certain exercises to help relieve your symptoms. Cervical radiculopathy can, and often does, get better with time and treatment. If your problems continue, treatment options may include:  · Wearing a soft collar for short periods of time.  · Physical therapy to strengthen the neck muscles.  · Medicines, such as nonsteroidal anti-inflammatory drugs (NSAIDs), oral corticosteroids, or spinal injections.  · Surgery. Different types of surgery may be done depending on the cause of your problems.  HOME CARE INSTRUCTIONS   · Put ice on the affected area.  · Put ice in a plastic bag.  · Place a towel between your skin and the bag.  · Leave the ice on for 15-20 minutes,  03-04 times a day or as directed by your caregiver.  · If ice does not help, you can try using heat. Take a warm shower or bath, or use a hot water bottle as directed by your caregiver.  · You may try a gentle neck and shoulder massage.  · Use a flat pillow when you sleep.  · Only take over-the-counter or prescription medicines for pain, discomfort, or fever as directed by your caregiver.  · If physical therapy was prescribed, follow your caregiver's directions.  · If a soft collar was prescribed, use it as directed.  SEEK IMMEDIATE MEDICAL CARE IF:   · Your pain gets much worse and cannot be controlled with medicines.  · You have weakness or numbness in your hand, arm, face, or leg.  · You have a high fever or a stiff, rigid neck.  · You lose bowel or bladder control (incontinence).  · You have trouble with walking, balance, or speaking.  MAKE SURE YOU:   · Understand these instructions.  · Will watch your condition.  · Will get help right away if you are not doing well or get worse.  Document Released: 07/25/2001 Document Revised: 01/22/2012 Document Reviewed: 06/13/2011  ExitCare® Patient Information ©2014 [employer].

## 2014-01-03 NOTE — ED Notes (Signed)
PT ambulated with baseline gait; VSS; A&Ox3; no signs of distress; respirations even and unlabored; skin warm and dry; no questions upon discharge.  

## 2014-01-03 NOTE — ED Notes (Signed)
Pain in upper chest, shoulders, and arm pain intermittant for 1 month increasing recently and not going away. Especially painful when she sits straight up, deep inspiration, laughing, coughing.

## 2014-01-03 NOTE — ED Notes (Signed)
Pt in c/o generalized body aches over the last two months, worse in her back

## 2014-01-04 NOTE — ED Provider Notes (Signed)
Medical screening examination/treatment/procedure(s) were performed by non-physician practitioner and as supervising physician I was immediately available for consultation/collaboration.   Hurman HornJohn M Damaria Stofko, MD 01/04/14 [phone]

## 2014-10-29 ENCOUNTER — Ambulatory Visit: Payer: Medicaid Other | Admitting: Obstetrics & Gynecology

## 2014-10-29 ENCOUNTER — Telehealth: Payer: Self-pay | Admitting: General Practice

## 2014-10-29 ENCOUNTER — Encounter: Payer: Self-pay | Admitting: General Practice

## 2014-10-29 NOTE — Telephone Encounter (Signed)
Patient no showed for appt today. Called patient, no answer- left message stating we are calling because it appears you missed an appt in our office today. Please call the front office staff to reschedule this appt. Will send letter

## 2015-10-27 ENCOUNTER — Emergency Department (INDEPENDENT_AMBULATORY_CARE_PROVIDER_SITE_OTHER)
Admission: EM | Admit: 2015-10-27 | Discharge: 2015-10-27 | Disposition: A | Discharge status: Home / Self Care | Payer: Self-pay | Source: Home / Self Care

## 2015-10-27 ENCOUNTER — Encounter (HOSPITAL_COMMUNITY): Payer: Self-pay | Admitting: Emergency Medicine

## 2015-10-27 DIAGNOSIS — J069 Acute upper respiratory infection, unspecified: Secondary | ICD-10-CM

## 2015-10-27 DIAGNOSIS — R110 Nausea: Secondary | ICD-10-CM

## 2015-10-27 LAB — POCT URINALYSIS DIP (DEVICE)
GLUCOSE, UA: NEGATIVE mg/dL
Hgb urine dipstick: NEGATIVE
Ketones, ur: 15 mg/dL — AB
LEUKOCYTES UA: NEGATIVE
NITRITE: NEGATIVE
Protein, ur: NEGATIVE mg/dL
UROBILINOGEN UA: 1 mg/dL (ref 0.0–1.0)
pH: 6 (ref 5.0–8.0)

## 2015-10-27 LAB — POCT PREGNANCY, URINE: PREG TEST UR: NEGATIVE

## 2015-10-27 LAB — POCT RAPID STREP A: Streptococcus, Group A Screen (Direct): NEGATIVE

## 2015-10-27 MED ORDER — AMOXICILLIN 250 MG PO CAPS: 250.0000 mg | ORAL_CAPSULE | Freq: Two times a day (BID) | ORAL | Status: DC

## 2015-10-27 MED ORDER — ONDANSETRON HCL 4 MG PO TABS: 4.0000 mg | ORAL_TABLET | Freq: Four times a day (QID) | ORAL | Status: DC

## 2015-10-27 NOTE — ED Notes (Signed)
[facility] with a complaint of a sore throat for 2 weeks and what she described as morning sickness for 3 to 4 weeks.

## 2015-10-27 NOTE — ED Provider Notes (Signed)
CSN: [id number]     Arrival date & time 10/27/15  1453 History   None    Chief Complaint  Patient presents with  . Sore Throat  . Morning Sickness   (Consider location/radiation/quality/duration/timing/severity/associated sxs/prior Treatment) HPI History obtained from patient:   LOCATION:upper resp SEVERITY: DURATION:3-4 days CONTEXT:  QUALITY: MODIFYING FACTORS:otc meds without relief ASSOCIATED SYMPTOMS: nausea, headache TIMING:constant OCCUPATION: call center  History reviewed. No pertinent past medical history. Past Surgical History  Procedure Laterality Date  . Wisdom tooth extraction     History reviewed. No pertinent family history. Social History  Substance Use Topics  . Smoking status: Never Smoker   . Smokeless tobacco: None  . Alcohol Use: No   OB History    No data available     Review of Systems ROS +'ve headache sore throat  Denies: HEADACHE, NAUSEA, ABDOMINAL PAIN, CHEST PAIN, CONGESTION, DYSURIA, SHORTNESS OF BREATH  Allergies  Review of patient's allergies indicates no known allergies.  Home Medications   Prior to Admission medications   Medication Sig Start Date End Date Taking? Authorizing Provider  guaiFENesin (ROBITUSSIN) 100 MG/5ML liquid Take 5-10 mLs (100-200 mg total) by mouth every 4 (four) hours as needed for cough. 01/03/14   Roxy Horsemanobert Browning, PA-C  ibuprofen (ADVIL,MOTRIN) 200 MG tablet Take 400 mg by mouth every 6 (six) hours as needed for moderate pain.    Historical Provider, MD  levonorgestrel (MIRENA) 20 MCG/24HR IUD 1 each by Intrauterine route once.    Historical Provider, MD  predniSONE (DELTASONE) 20 MG tablet Take 2 tablets (40 mg total) by mouth daily. 01/03/14   Roxy Horsemanobert Browning, PA-C   Meds Ordered and Administered this Visit  Medications - No data to display  BP 137/83 mmHg  Pulse 82  Temp(Src) 98.1 F (36.7 C) (Oral)  Resp 16  LMP 10/04/2015 (Approximate) No data found.   Physical Exam  Constitutional: She is  oriented to person, place, and time. She appears well-developed and well-nourished.  Eyes: Conjunctivae are normal.  Neck: Normal range of motion. Neck supple.  Cardiovascular: Normal rate.   Pulmonary/Chest: Effort normal and breath sounds normal.  Abdominal: Soft.  Musculoskeletal: Normal range of motion.  Neurological: She is alert and oriented to person, place, and time.  Skin: Skin is warm and dry.  Psychiatric: She has a normal mood and affect. Her behavior is normal. Judgment and thought content normal.  Nursing note and vitals reviewed.   ED Course  Procedures (including critical care time)  Labs Review Labs Reviewed  POCT URINALYSIS DIP (DEVICE) - Abnormal; Notable for the following:    Bilirubin Urine SMALL (*)    Ketones, ur 15 (*)    All other components within normal limits  POCT RAPID STREP A  POCT PREGNANCY, URINE    Imaging Review No results found.   Visual Acuity Review  Right Eye Distance:   Left Eye Distance:   Bilateral Distance:    Right Eye Near:   Left Eye Near:    Bilateral Near:         MDM   1. URI (upper respiratory infection)   2. Nausea    prescription for Zofran for nausea provided. Suggest symptomatic treatment at home. No indication for antibiotics at this time. Instructions of care provided discharged home in stable condition return to work note provided.  THIS NOTE WAS GENERATED USING A VOICE RECOGNITION SOFTWARE PROGRAM. ALL REASONABLE EFFORTS  WERE MADE TO PROOFREAD THIS DOCUMENT FOR ACCURACY.     Homero FellersFrank  [place], [place] 10/27/15 1750

## 2015-10-27 NOTE — Discharge Instructions (Signed)
Nausea and Vomiting Nausea means you feel sick to your stomach. Throwing up (vomiting) is a reflex where stomach contents come out of your mouth. HOME CARE   Take medicine as told by your doctor.  Do not force yourself to eat. However, you do need to drink fluids.  If you feel like eating, eat a normal diet as told by your doctor.  Eat rice, wheat, potatoes, bread, lean meats, yogurt, fruits, and vegetables.  Avoid high-fat foods.  Drink enough fluids to keep your pee (urine) clear or pale yellow.  Ask your doctor how to replace body fluid losses (rehydrate). Signs of body fluid loss (dehydration) include:  Feeling very thirsty.  Dry lips and mouth.  Feeling dizzy.  Dark pee.  Peeing less than normal.  Feeling confused.  Fast breathing or heart rate. GET HELP RIGHT AWAY IF:   You have blood in your throw up.  You have black or bloody poop (stool).  You have a bad headache or stiff neck.  You feel confused.  You have bad belly (abdominal) pain.  You have chest pain or trouble breathing.  You do not pee at least once every 8 hours.  You have cold, clammy skin.  You keep throwing up after 24 to 48 hours.  You have a fever. MAKE SURE YOU:   Understand these instructions.  Will watch your condition.  Will get help right away if you are not doing well or get worse.   This information is not intended to replace advice given to you by your health care provider. Make sure you discuss any questions you have with your health care provider.   Document Released: 04/17/2008 Document Revised: 01/22/2012 Document Reviewed: 03/31/2011 Elsevier Interactive Patient Education 2016 Elsevier Inc. Upper Respiratory Infection, Adult Most upper respiratory infections (URIs) are caused by a virus. A URI affects the nose, throat, and upper air passages. The most common type of URI is often called "the common cold." HOME CARE   Take medicines only as told by your  doctor.  Gargle warm saltwater or take cough drops to comfort your throat as told by your doctor.  Use a warm mist humidifier or inhale steam from a shower to increase air moisture. This may make it easier to breathe.  Drink enough fluid to keep your pee (urine) clear or pale yellow.  Eat soups and other clear broths.  Have a healthy diet.  Rest as needed.  Go back to work when your fever is gone or your doctor says it is okay.  You may need to stay home longer to avoid giving your URI to others.  You can also wear a face mask and wash your hands often to prevent spread of the virus.  Use your inhaler more if you have asthma.  Do not use any tobacco products, including cigarettes, chewing tobacco, or electronic cigarettes. If you need help quitting, ask your doctor. GET HELP IF:  You are getting worse, not better.  Your symptoms are not helped by medicine.  You have chills.  You are getting more short of breath.  You have brown or red mucus.  You have yellow or brown discharge from your nose.  You have pain in your face, especially when you bend forward.  You have a fever.  You have puffy (swollen) neck glands.  You have pain while swallowing.  You have white areas in the back of your throat. GET HELP RIGHT AWAY IF:   You have very bad or  constant:  Headache.  Ear pain.  Pain in your forehead, behind your eyes, and over your cheekbones (sinus pain).  Chest pain.  You have long-lasting (chronic) lung disease and any of the following:  Wheezing.  Long-lasting cough.  Coughing up blood.  A change in your usual mucus.  You have a stiff neck.  You have changes in your:  Vision.  Hearing.  Thinking.  Mood. MAKE SURE YOU:   Understand these instructions.  Will watch your condition.  Will get help right away if you are not doing well or get worse.   This information is not intended to replace advice given to you by your health care  provider. Make sure you discuss any questions you have with your health care provider.   Document Released: 04/17/2008 Document Revised: 03/16/2015 Document Reviewed: 02/04/2014 Elsevier Interactive Patient Education [employer]2016 Elsevier Inc.

## 2015-10-30 LAB — CULTURE, GROUP A STREP

## 2016-02-28 ENCOUNTER — Ambulatory Visit: Payer: Medicaid Other | Admitting: Obstetrics and Gynecology

## 2020-04-03 ENCOUNTER — Ambulatory Visit: Payer: Medicaid Other | Attending: Internal Medicine

## 2020-04-03 DIAGNOSIS — Z23 Encounter for immunization: Secondary | ICD-10-CM

## 2020-04-03 NOTE — Progress Notes (Signed)
   Covid-19 Vaccination Clinic  Name:  Molly Sims    MRN: [id number] DOB: 1986/02/18  04/03/2020  Molly Sims was observed post Covid-19 immunization for 15 minutes without incident. She was provided with Vaccine Information Sheet and instruction to access the V-Safe system.   Molly Sims was instructed to call 911 with any severe reactions post vaccine: Marland Kitchen Difficulty breathing  . Swelling of face and throat  . A fast heartbeat  . A bad rash all over body  . Dizziness and weakness   Immunizations Administered    Name Date Dose VIS Date Route   Pfizer COVID-19 Vaccine 04/03/2020 11:55 AM 0.3 mL 01/07/2019 Intramuscular   Manufacturer: [employer], [employer]   Lot: [id number]   NDC: [address]-8

## 2020-11-13 NOTE — L&D Delivery Note (Addendum)
OB/GYN Faculty Practice Delivery Note  Molly Sims is a 35 y.o. [id number] s/p VD at [redacted]w[redacted]d. She was admitted for PPROM.   ROM: 43h 25m with clear fluid GBS Status: Unknown-PCN   Maximum Maternal Temperature: 98.5  Labor Progress: Initial SVE: 2.5cm/thick/high. S/p cytotec, FB, pitocin. She then progressed to complete.   Delivery Date/Time: 08/24/21 @0743   Delivery: Called to room and patient was complete and pushing. Head delivered ROA. No nuchal cord present. Shoulder and body delivered in usual fashion. Infant with spontaneous cry, placed on mother's abdomen, dried and stimulated. Cord clamped x 2 after 1-minute delay, and cut by father of infant. Cord blood drawn. Placenta delivered spontaneously with gentle cord traction. Fundus firm with massage and Pitocin. Labia, perineum, vagina, and cervix inspected with no lacerations noted.   Baby Weight: Pending  Placenta: 3 vessel, intact. Sent to pathology. Complications: None Lacerations: None EBL: 75 mL Analgesia: Epidural   Infant:  APGAR (1 MIN): 8 APGAR (5 MINS): 9  Simone Autry-Lott, DO 08/24/2021, 8:00 AM PGY-3, [facility]  GME ATTESTATION:  I saw and evaluated the patient. I was gloved and present for the entire delivery and management of the patient. I agree with the findings and the plan of care as documented in the resident's note. I have made changes to documentation as necessary.  10/24/2021, MD OB Fellow, Faculty [facility], Center for [facility] 08/24/2021 8:45 AM

## 2021-03-14 ENCOUNTER — Ambulatory Visit (INDEPENDENT_AMBULATORY_CARE_PROVIDER_SITE_OTHER): Payer: Medicaid Other

## 2021-03-14 ENCOUNTER — Other Ambulatory Visit: Payer: Self-pay

## 2021-03-14 VITALS — BP 137/76 | HR 98 | Ht 62.0 in | Wt 256.4 lb

## 2021-03-14 DIAGNOSIS — Z3481 Encounter for supervision of other normal pregnancy, first trimester: Secondary | ICD-10-CM

## 2021-03-14 DIAGNOSIS — O3680X0 Pregnancy with inconclusive fetal viability, not applicable or unspecified: Secondary | ICD-10-CM

## 2021-03-14 DIAGNOSIS — Z3A09 9 weeks gestation of pregnancy: Secondary | ICD-10-CM | POA: Diagnosis not present

## 2021-03-14 DIAGNOSIS — Z3480 Encounter for supervision of other normal pregnancy, unspecified trimester: Secondary | ICD-10-CM | POA: Insufficient documentation

## 2021-03-14 DIAGNOSIS — Z3491 Encounter for supervision of normal pregnancy, unspecified, first trimester: Secondary | ICD-10-CM | POA: Insufficient documentation

## 2021-03-14 DIAGNOSIS — Z3A11 11 weeks gestation of pregnancy: Secondary | ICD-10-CM

## 2021-03-14 MED ORDER — BLOOD PRESSURE KIT DEVI: 1.0000 | 0 refills | Status: DC

## 2021-03-14 NOTE — Progress Notes (Signed)
PRENATAL INTAKE SUMMARY  Molly Sims presents today New OB Nurse Interview.  OB History    Gravida  2   Para  1   Term      Preterm  1   AB      Living  1     SAB      IAB      Ectopic      Multiple      Live Births  1          I have reviewed the patient's medical, obstetrical, social, and family histories, medications, and available lab results.  SUBJECTIVE She has no unusual complaints  OBJECTIVE Initial Physical Exam (New OB)  GENERAL APPEARANCE: alert, well appearing   ASSESSMENT Normal pregnancy  PLAN Prenatal care to be completed at [place] [facility] labs to be completed at [facility] Provider visit Baby Scripts ordered Blood pressure kit ordered U/S performed today reveals single live IUP at 15w6dby [place] FHR 166 PHQ 9 score: 0 GAD 7 score: 0

## 2021-03-15 NOTE — Progress Notes (Signed)
Patient was assessed and managed by nursing staff during this encounter. I have reviewed the chart and agree with the documentation and plan. I have also made any necessary editorial changes.  Catalina Antigua, MD 03/15/2021 8:34 AM

## 2021-03-22 ENCOUNTER — Encounter: Payer: Medicaid Other | Admitting: Obstetrics and Gynecology

## 2021-04-08 ENCOUNTER — Other Ambulatory Visit: Payer: Self-pay

## 2021-04-08 ENCOUNTER — Ambulatory Visit (INDEPENDENT_AMBULATORY_CARE_PROVIDER_SITE_OTHER): Payer: Medicaid Other | Admitting: Obstetrics

## 2021-04-08 ENCOUNTER — Encounter: Payer: Self-pay | Admitting: Obstetrics

## 2021-04-08 ENCOUNTER — Other Ambulatory Visit (HOSPITAL_COMMUNITY)
Admission: RE | Admit: 2021-04-08 | Discharge: 2021-04-08 | Disposition: A | Discharge status: Home / Self Care | Payer: Medicaid Other | Source: Ambulatory Visit | Attending: Obstetrics | Admitting: Obstetrics

## 2021-04-08 VITALS — Wt 261.3 lb

## 2021-04-08 DIAGNOSIS — O99210 Obesity complicating pregnancy, unspecified trimester: Secondary | ICD-10-CM | POA: Diagnosis not present

## 2021-04-08 DIAGNOSIS — Z3480 Encounter for supervision of other normal pregnancy, unspecified trimester: Secondary | ICD-10-CM | POA: Diagnosis not present

## 2021-04-08 DIAGNOSIS — Z3481 Encounter for supervision of other normal pregnancy, first trimester: Secondary | ICD-10-CM | POA: Insufficient documentation

## 2021-04-08 MED ORDER — VITAFOL ULTRA 29-0.6-0.4-200 MG PO CAPS
1.0000 | ORAL_CAPSULE | Freq: Every day | ORAL | 4 refills | Status: DC
Start: 2021-04-08 — End: 2022-02-23

## 2021-04-08 NOTE — Progress Notes (Signed)
Pt presents for NOB visit This not a planned pregnancy. FOB is involved.  Pap due today - pt unsure last one

## 2021-04-08 NOTE — Progress Notes (Signed)
Subjective:    Molly Sims is being seen today for her first obstetrical visit.  This is not a planned pregnancy. She is at [redacted]w[redacted]d gestation. Her obstetrical history is significant for obesity. Relationship with FOB: significant other, living together. Patient does intend to breast feed. Pregnancy history fully reviewed.  The information documented in the HPI was reviewed and verified.  Menstrual History: OB History    Gravida  2   Para  1   Term      Preterm  1   AB      Living  1     SAB      IAB      Ectopic      Multiple      Live Births  1            Patient's last menstrual period was 12/30/2020.    History reviewed. No pertinent past medical history.  Past Surgical History:  Procedure Laterality Date  . WISDOM TOOTH EXTRACTION      (Not in a hospital admission)  No Known Allergies  Social History   Tobacco Use  . Smoking status: Never Smoker  . Smokeless tobacco: Never Used  Substance Use Topics  . Alcohol use: Not Currently    Comment: Not since confirmed pregnancy    Family History  Problem Relation Age of Onset  . Hypertension Mother      Review of Systems Constitutional: negative for weight loss Gastrointestinal: negative for vomiting Genitourinary:negative for genital lesions and vaginal discharge and dysuria Musculoskeletal:negative for back pain Behavioral/Psych: negative for abusive relationship, depression, illegal drug usage and tobacco use    Objective:    Wt 261 lb 4.8 oz (118.5 kg)   LMP 12/30/2020   BMI 47.79 kg/m  General Appearance:    Alert, cooperative, no distress, appears stated age  Head:    Normocephalic, without obvious abnormality, atraumatic  Eyes:    PERRL, conjunctiva/corneas clear, EOM's intact, fundi    benign, both eyes  Ears:    Normal TM's and external ear canals, both ears  Nose:   Nares normal, septum midline, mucosa normal, no drainage    or sinus tenderness  Throat:   Lips, mucosa, and  tongue normal; teeth and gums normal  Neck:   Supple, symmetrical, trachea midline, no adenopathy;    thyroid:  no enlargement/tenderness/nodules; no carotid   bruit or JVD  Back:     Symmetric, no curvature, ROM normal, no CVA tenderness  Lungs:     Clear to auscultation bilaterally, respirations unlabored  Chest Wall:    No tenderness or deformity   Heart:    Regular rate and rhythm, S1 and S2 normal, no murmur, rub   or gallop  Breast Exam:    No tenderness, masses, or nipple abnormality  Abdomen:     Soft, non-tender, bowel sounds active all four quadrants,    no masses, no organomegaly  Genitalia:    Normal female without lesion, discharge or tenderness  Extremities:   Extremities normal, atraumatic, no cyanosis or edema  Pulses:   2+ and symmetric all extremities  Skin:   Skin color, texture, turgor normal, no rashes or lesions  Lymph nodes:   Cervical, supraclavicular, and axillary nodes normal  Neurologic:   CNII-XII intact, normal strength, sensation and reflexes    throughout      Lab Review Urine pregnancy test Labs reviewed yes Radiologic studies reviewed no  Assessment:     1. Supervision of  other normal pregnancy, antepartum Rx: - Genetic Screening - CBC/D/Plt+RPR+Rh+ABO+Rub Ab... - Culture, OB Urine - AFP, Serum, Open Spina Bifida - Cervicovaginal ancillary only  2. Obesity affecting pregnancy, antepartum    Plan:     Prenatal vitamins.  Counseling provided regarding continued use of seat belts, cessation of alcohol consumption, smoking or use of illicit drugs; infection precautions i.e., influenza/TDAP immunizations, toxoplasmosis,CMV, parvovirus, listeria and varicella; workplace safety, exercise during pregnancy; routine dental care, safe medications, sexual activity, hot tubs, saunas, pools, travel, caffeine use, fish and methlymercury, potential toxins, hair treatments, varicose veins Weight gain recommendations per IOM guidelines reviewed:  underweight/BMI< 18.5--> gain 28 - 40 lbs; normal weight/BMI 18.5 - 24.9--> gain 25 - 35 lbs; overweight/BMI 25 - 29.9--> gain 15 - 25 lbs; obese/BMI >30->gain  11 - 20 lbs Problem list reviewed and updated. FIRST/CF mutation testing/NIPT/QUAD SCREEN/fragile X/Ashkenazi Jewish population testing/Spinal muscular atrophy discussed: requested. Role of ultrasound in pregnancy discussed; fetal survey: requested. Amniocentesis discussed: not indicated.  Meds ordered this encounter  Medications  . Prenat-Fe Poly-Methfol-FA-DHA (VITAFOL ULTRA) 29-0.6-0.4-200 MG CAPS    Sig: Take 1 capsule by mouth daily before breakfast.    Dispense:  90 capsule    Refill:  4   Orders Placed This Encounter  Procedures  . Culture, OB Urine  . [place] MFM OB COMP + 14 WK    Standing Status:   Future    Standing Expiration Date:   04/08/2022    Order Specific Question:   Reason for Exam (SYMPTOM  OR DIAGNOSIS REQUIRED)    Answer:   Anatomy    Order Specific Question:   Preferred Location    Answer:   [facility]  . Genetic Screening    [facility]  . AFP, Serum, Open Spina Bifida    Order Specific Question:   Is patient insulin dependent?    Answer:   No    Order Specific Question:   Patient weight (lb.)    Answer:   261 lb 4.8 oz (118.5 kg)    Order Specific Question:   Gestational Age (GA), weeks    Answer:   15.3    Order Specific Question:   Date on which patient was at this GA    Answer:   04/08/2021    Order Specific Question:   GA Calculation Method    Answer:   LMP    Order Specific Question:   Number of fetuses    Answer:   1    Order Specific Question:   Reason for screen    Answer:   OTHER    Comments:   routine     Order Specific Question:   Donor egg?    Answer:   N    Order Specific Question:   Age of egg donor?    Answer:   0  . Obstetric Panel, Including HIV    Follow up in 4 weeks.  I have spent a total of 20 minutes of face-to-face and time, excluding clinical staff time,  reviewing notes and preparing to see patient, ordering tests and/or medications, and counseling the patient.  Brock Bad, MD 04/08/2021 1:01 PM

## 2021-04-09 ENCOUNTER — Other Ambulatory Visit: Payer: Self-pay | Admitting: Obstetrics

## 2021-04-09 DIAGNOSIS — D508 Other iron deficiency anemias: Secondary | ICD-10-CM

## 2021-04-09 LAB — OBSTETRIC PANEL, INCLUDING HIV
Antibody Screen: NEGATIVE
Basophils Absolute: 0 10*3/uL (ref 0.0–0.2)
Basos: 0 %
EOS (ABSOLUTE): 0.1 10*3/uL (ref 0.0–0.4)
Eos: 1 %
HIV Screen 4th Generation wRfx: NONREACTIVE
Hematocrit: 33.3 % — ABNORMAL LOW (ref 34.0–46.6)
Hemoglobin: 10.9 g/dL — ABNORMAL LOW (ref 11.1–15.9)
Hepatitis B Surface Ag: NEGATIVE
Immature Grans (Abs): 0 10*3/uL (ref 0.0–0.1)
Immature Granulocytes: 0 %
Lymphocytes Absolute: 2.1 10*3/uL (ref 0.7–3.1)
Lymphs: 21 %
MCH: 29.4 pg (ref 26.6–33.0)
MCHC: 32.7 g/dL (ref 31.5–35.7)
MCV: 90 fL (ref 79–97)
Monocytes Absolute: 0.7 10*3/uL (ref 0.1–0.9)
Monocytes: 7 %
Neutrophils Absolute: 7.3 10*3/uL — ABNORMAL HIGH (ref 1.4–7.0)
Neutrophils: 71 %
Platelets: 298 10*3/uL (ref 150–450)
RBC: 3.71 x10E6/uL — ABNORMAL LOW (ref 3.77–5.28)
RDW: 13.3 % (ref 11.7–15.4)
RPR Ser Ql: NONREACTIVE
Rh Factor: POSITIVE
Rubella Antibodies, IGG: 1.13 index (ref >0.99)
WBC: 10.2 10*3/uL (ref 3.4–10.8)

## 2021-04-09 MED ORDER — FERROUS SULFATE 325 (65 FE) MG PO TABS: 325.0000 mg | ORAL_TABLET | ORAL | 5 refills | Status: DC

## 2021-04-10 LAB — AFP, SERUM, OPEN SPINA BIFIDA
AFP MoM: 1.41
AFP Value: 35.4 ng/mL
Gest. Age on Collection Date: 15.3 weeks
Maternal Age At EDD: 34.1 yr
OSBR Risk 1 IN: 7002
Test Results:: NEGATIVE
Weight: 261 [lb_av]

## 2021-04-10 LAB — CULTURE, OB URINE

## 2021-04-10 LAB — URINE CULTURE, OB REFLEX

## 2021-04-12 ENCOUNTER — Other Ambulatory Visit (HOSPITAL_COMMUNITY)
Admission: RE | Admit: 2021-04-12 | Discharge: 2021-04-12 | Disposition: A | Discharge status: Home / Self Care | Payer: Medicaid Other | Source: Ambulatory Visit | Attending: Obstetrics and Gynecology | Admitting: Obstetrics and Gynecology

## 2021-04-12 DIAGNOSIS — Z3480 Encounter for supervision of other normal pregnancy, unspecified trimester: Secondary | ICD-10-CM | POA: Insufficient documentation

## 2021-04-12 LAB — CERVICOVAGINAL ANCILLARY ONLY
Bacterial Vaginitis (gardnerella): POSITIVE — AB
Candida Glabrata: NEGATIVE
Candida Vaginitis: POSITIVE — AB
Chlamydia: NEGATIVE
Comment: NEGATIVE
Comment: NEGATIVE
Comment: NEGATIVE
Comment: NEGATIVE
Comment: NEGATIVE
Comment: NORMAL
Neisseria Gonorrhea: NEGATIVE
Trichomonas: NEGATIVE

## 2021-04-12 NOTE — Addendum Note (Signed)
Addended by: Marya Landry D on: 04/12/2021 09:44 AM   Modules accepted: Orders

## 2021-04-13 ENCOUNTER — Other Ambulatory Visit: Payer: Self-pay | Admitting: Obstetrics

## 2021-04-13 DIAGNOSIS — B9689 Other specified bacterial agents as the cause of diseases classified elsewhere: Secondary | ICD-10-CM

## 2021-04-13 DIAGNOSIS — N760 Acute vaginitis: Secondary | ICD-10-CM

## 2021-04-13 DIAGNOSIS — B379 Candidiasis, unspecified: Secondary | ICD-10-CM

## 2021-04-13 LAB — CYTOLOGY - PAP
Comment: NEGATIVE
Diagnosis: NEGATIVE
High risk HPV: POSITIVE — AB

## 2021-04-13 MED ORDER — METRONIDAZOLE 500 MG PO TABS: 500.0000 mg | ORAL_TABLET | Freq: Two times a day (BID) | ORAL | 2 refills | Status: DC

## 2021-04-13 MED ORDER — TERCONAZOLE 0.4 % VA CREA: 1.0000 | TOPICAL_CREAM | Freq: Every day | VAGINAL | 0 refills | Status: DC

## 2021-04-18 ENCOUNTER — Encounter: Payer: Self-pay | Admitting: Obstetrics

## 2021-05-03 ENCOUNTER — Other Ambulatory Visit: Payer: Self-pay

## 2021-05-03 ENCOUNTER — Other Ambulatory Visit: Payer: Self-pay | Admitting: *Deleted

## 2021-05-03 ENCOUNTER — Ambulatory Visit: Payer: Medicaid Other | Attending: Obstetrics

## 2021-05-03 ENCOUNTER — Other Ambulatory Visit: Payer: Self-pay | Admitting: Obstetrics

## 2021-05-03 DIAGNOSIS — Z3480 Encounter for supervision of other normal pregnancy, unspecified trimester: Secondary | ICD-10-CM

## 2021-05-03 DIAGNOSIS — D563 Thalassemia minor: Secondary | ICD-10-CM

## 2021-05-09 ENCOUNTER — Ambulatory Visit (INDEPENDENT_AMBULATORY_CARE_PROVIDER_SITE_OTHER): Payer: Medicaid Other | Admitting: Obstetrics

## 2021-05-09 ENCOUNTER — Other Ambulatory Visit: Payer: Self-pay

## 2021-05-09 ENCOUNTER — Ambulatory Visit (INDEPENDENT_AMBULATORY_CARE_PROVIDER_SITE_OTHER): Payer: Medicaid Other | Admitting: Primary Care

## 2021-05-09 ENCOUNTER — Encounter: Payer: Self-pay | Admitting: Obstetrics

## 2021-05-09 VITALS — BP 131/88 | HR 91 | Wt 263.0 lb

## 2021-05-09 DIAGNOSIS — Z3480 Encounter for supervision of other normal pregnancy, unspecified trimester: Secondary | ICD-10-CM | POA: Diagnosis not present

## 2021-05-09 DIAGNOSIS — D508 Other iron deficiency anemias: Secondary | ICD-10-CM

## 2021-05-09 DIAGNOSIS — O99210 Obesity complicating pregnancy, unspecified trimester: Secondary | ICD-10-CM | POA: Diagnosis not present

## 2021-05-09 NOTE — Progress Notes (Signed)
Subjective:  Molly Sims is a 35 y.o. G2P0101 at [redacted]w[redacted]d being seen today for ongoing prenatal care.  She is currently monitored for the following issues for this low-risk pregnancy and has Encounter for supervision of normal pregnancy in first trimester on their problem list.  Patient reports no complaints.  Contractions: Not present. Vag. Bleeding: None.   . Denies leaking of fluid.   The following portions of the patient's history were reviewed and updated as appropriate: allergies, current medications, past family history, past medical history, past social history, past surgical history and problem list. Problem list updated.  Objective:   Vitals:   05/09/21 0930  BP: 131/88  Pulse: 91  Weight: 263 lb (119.3 kg)    Fetal Status:           General:  Alert, oriented and cooperative. Patient is in no acute distress.  Skin: Skin is warm and dry. No rash noted.   Cardiovascular: Normal heart rate noted  Respiratory: Normal respiratory effort, no problems with respiration noted  Abdomen: Soft, gravid, appropriate for gestational age. Pain/Pressure: Absent     Pelvic:  Cervical exam deferred        Extremities: Normal range of motion.     Mental Status: Normal mood and affect. Normal behavior. Normal judgment and thought content.   Urinalysis:      Assessment and Plan:  Pregnancy: G2P0101 at [redacted]w[redacted]d  1. Supervision of other normal pregnancy, antepartum  2. Obesity affecting pregnancy, antepartum  3. Iron deficiency anemia secondary to inadequate dietary iron intake - taking iron / PNV's   Preterm labor symptoms and general obstetric precautions including but not limited to vaginal bleeding, contractions, leaking of fluid and fetal movement were reviewed in detail with the patient. Please refer to After Visit Summary for other counseling recommendations.   Return in about 4 weeks (around 06/06/2021) for ROB.   Brock Bad, MD  05/09/21

## 2021-05-31 ENCOUNTER — Ambulatory Visit: Payer: Medicaid Other | Admitting: *Deleted

## 2021-05-31 ENCOUNTER — Other Ambulatory Visit: Payer: Self-pay

## 2021-05-31 ENCOUNTER — Other Ambulatory Visit: Payer: Self-pay | Admitting: *Deleted

## 2021-05-31 ENCOUNTER — Encounter: Payer: Self-pay | Admitting: *Deleted

## 2021-05-31 ENCOUNTER — Ambulatory Visit: Payer: Medicaid Other

## 2021-05-31 ENCOUNTER — Ambulatory Visit: Payer: Medicaid Other | Attending: Obstetrics and Gynecology

## 2021-05-31 VITALS — BP 126/73 | HR 98

## 2021-05-31 DIAGNOSIS — E669 Obesity, unspecified: Secondary | ICD-10-CM

## 2021-05-31 DIAGNOSIS — Z6841 Body Mass Index (BMI) 40.0 and over, adult: Secondary | ICD-10-CM

## 2021-05-31 DIAGNOSIS — O289 Unspecified abnormal findings on antenatal screening of mother: Secondary | ICD-10-CM | POA: Diagnosis not present

## 2021-05-31 DIAGNOSIS — D563 Thalassemia minor: Secondary | ICD-10-CM | POA: Diagnosis not present

## 2021-05-31 DIAGNOSIS — O368390 Maternal care for abnormalities of the fetal heart rate or rhythm, unspecified trimester, not applicable or unspecified: Secondary | ICD-10-CM

## 2021-05-31 DIAGNOSIS — O368330 Maternal care for abnormalities of the fetal heart rate or rhythm, third trimester, not applicable or unspecified: Secondary | ICD-10-CM | POA: Diagnosis not present

## 2021-05-31 DIAGNOSIS — O99213 Obesity complicating pregnancy, third trimester: Secondary | ICD-10-CM | POA: Diagnosis not present

## 2021-05-31 DIAGNOSIS — Z3A23 23 weeks gestation of pregnancy: Secondary | ICD-10-CM

## 2021-05-31 DIAGNOSIS — Z148 Genetic carrier of other disease: Secondary | ICD-10-CM

## 2021-06-01 ENCOUNTER — Ambulatory Visit: Payer: Medicaid Other

## 2021-06-06 ENCOUNTER — Encounter: Payer: Medicaid Other | Admitting: Obstetrics and Gynecology

## 2021-06-06 DIAGNOSIS — O99210 Obesity complicating pregnancy, unspecified trimester: Secondary | ICD-10-CM | POA: Insufficient documentation

## 2021-06-07 ENCOUNTER — Encounter: Payer: Medicaid Other | Admitting: Obstetrics

## 2021-06-14 ENCOUNTER — Encounter: Payer: Self-pay | Admitting: Obstetrics

## 2021-06-14 ENCOUNTER — Other Ambulatory Visit: Payer: Self-pay

## 2021-06-14 ENCOUNTER — Ambulatory Visit (INDEPENDENT_AMBULATORY_CARE_PROVIDER_SITE_OTHER): Payer: Medicaid Other | Admitting: Obstetrics

## 2021-06-14 VITALS — BP 123/81 | HR 91 | Wt 266.0 lb

## 2021-06-14 DIAGNOSIS — Z3A25 25 weeks gestation of pregnancy: Secondary | ICD-10-CM

## 2021-06-14 DIAGNOSIS — O99210 Obesity complicating pregnancy, unspecified trimester: Secondary | ICD-10-CM

## 2021-06-14 DIAGNOSIS — Z3480 Encounter for supervision of other normal pregnancy, unspecified trimester: Secondary | ICD-10-CM

## 2021-06-14 DIAGNOSIS — D508 Other iron deficiency anemias: Secondary | ICD-10-CM

## 2021-06-14 NOTE — Progress Notes (Addendum)
Subjective:  Molly Sims is a 35 y.o. G2P0101 at [redacted]w[redacted]d being seen today for ongoing prenatal care.  She is currently monitored for the following issues for this low-risk pregnancy and has Supervision of other normal pregnancy, antepartum and Maternal obesity affecting pregnancy, antepartum on their problem list.  Patient reports no complaints.  Contractions: Not present. Vag. Bleeding: None.  Movement: Present. Denies leaking of fluid.   The following portions of the patient's history were reviewed and updated as appropriate: allergies, current medications, past family history, past medical history, past social history, past surgical history and problem list. Problem list updated.  Objective:   Vitals:   06/14/21 1355  BP: 123/81  Pulse: 91  Weight: 266 lb (120.7 kg)    Fetal Status:     Movement: Present     General:  Alert, oriented and cooperative. Patient is in no acute distress.  Skin: Skin is warm and dry. No rash noted.   Cardiovascular: Normal heart rate noted  Respiratory: Normal respiratory effort, no problems with respiration noted  Abdomen: Soft, gravid, appropriate for gestational age. Pain/Pressure: Absent     Pelvic:  Cervical exam deferred        Extremities: Normal range of motion.     Mental Status: Normal mood and affect. Normal behavior. Normal judgment and thought content.   Urinalysis:      Assessment and Plan:  Pregnancy: G2P0101 at [redacted]w[redacted]d  1. Supervision of other normal pregnancy, antepartum  2. Obesity affecting pregnancy, antepartum  3. Iron deficiency anemia secondary to inadequate dietary iron intake    Preterm labor symptoms and general obstetric precautions including but not limited to vaginal bleeding, contractions, leaking of fluid and fetal movement were reviewed in detail with the patient. Please refer to After Visit Summary for other counseling recommendations.   Return in about 3 weeks (around 07/05/2021) for ROB, 2 hour  OGTT.   Brock Bad, MD  06/14/21

## 2021-06-28 ENCOUNTER — Ambulatory Visit: Payer: Medicaid Other

## 2021-07-04 ENCOUNTER — Encounter: Payer: Self-pay | Admitting: *Deleted

## 2021-07-04 ENCOUNTER — Ambulatory Visit: Payer: Medicaid Other | Attending: Obstetrics and Gynecology

## 2021-07-04 ENCOUNTER — Other Ambulatory Visit: Payer: Self-pay

## 2021-07-04 ENCOUNTER — Ambulatory Visit: Payer: Medicaid Other | Admitting: *Deleted

## 2021-07-04 ENCOUNTER — Other Ambulatory Visit: Payer: Self-pay | Admitting: *Deleted

## 2021-07-04 VITALS — BP 134/76 | HR 94

## 2021-07-04 DIAGNOSIS — Z3480 Encounter for supervision of other normal pregnancy, unspecified trimester: Secondary | ICD-10-CM | POA: Diagnosis present

## 2021-07-04 DIAGNOSIS — O99210 Obesity complicating pregnancy, unspecified trimester: Secondary | ICD-10-CM

## 2021-07-04 DIAGNOSIS — E669 Obesity, unspecified: Secondary | ICD-10-CM

## 2021-07-04 DIAGNOSIS — Z148 Genetic carrier of other disease: Secondary | ICD-10-CM

## 2021-07-04 DIAGNOSIS — O289 Unspecified abnormal findings on antenatal screening of mother: Secondary | ICD-10-CM | POA: Diagnosis not present

## 2021-07-04 DIAGNOSIS — O99212 Obesity complicating pregnancy, second trimester: Secondary | ICD-10-CM | POA: Diagnosis not present

## 2021-07-04 DIAGNOSIS — O368320 Maternal care for abnormalities of the fetal heart rate or rhythm, second trimester, not applicable or unspecified: Secondary | ICD-10-CM | POA: Diagnosis not present

## 2021-07-04 DIAGNOSIS — Z3A27 27 weeks gestation of pregnancy: Secondary | ICD-10-CM

## 2021-07-04 DIAGNOSIS — Z6841 Body Mass Index (BMI) 40.0 and over, adult: Secondary | ICD-10-CM

## 2021-07-04 DIAGNOSIS — O368390 Maternal care for abnormalities of the fetal heart rate or rhythm, unspecified trimester, not applicable or unspecified: Secondary | ICD-10-CM | POA: Insufficient documentation

## 2021-07-05 ENCOUNTER — Other Ambulatory Visit: Payer: Medicaid Other

## 2021-07-05 ENCOUNTER — Telehealth: Payer: Self-pay | Admitting: *Deleted

## 2021-07-05 ENCOUNTER — Encounter: Payer: Medicaid Other | Admitting: Obstetrics

## 2021-07-05 NOTE — Telephone Encounter (Signed)
Telephone call to patient after no show for 28 wk OB visit. Encouraged to call the office and reschedule.

## 2021-07-19 ENCOUNTER — Inpatient Hospital Stay (HOSPITAL_COMMUNITY)
Admission: AD | Admit: 2021-07-19 | Discharge: 2021-07-19 | Disposition: A | Discharge status: Home / Self Care | Payer: Medicaid Other | Attending: Obstetrics & Gynecology | Admitting: Obstetrics & Gynecology

## 2021-07-19 ENCOUNTER — Encounter (HOSPITAL_COMMUNITY): Payer: Self-pay | Admitting: Obstetrics & Gynecology

## 2021-07-19 ENCOUNTER — Other Ambulatory Visit: Payer: Self-pay

## 2021-07-19 DIAGNOSIS — O2633 Retained intrauterine contraceptive device in pregnancy, third trimester: Secondary | ICD-10-CM | POA: Insufficient documentation

## 2021-07-19 DIAGNOSIS — Z3A30 30 weeks gestation of pregnancy: Secondary | ICD-10-CM | POA: Insufficient documentation

## 2021-07-19 DIAGNOSIS — O99210 Obesity complicating pregnancy, unspecified trimester: Secondary | ICD-10-CM

## 2021-07-19 DIAGNOSIS — O4693 Antepartum hemorrhage, unspecified, third trimester: Secondary | ICD-10-CM | POA: Diagnosis not present

## 2021-07-19 DIAGNOSIS — Z3480 Encounter for supervision of other normal pregnancy, unspecified trimester: Secondary | ICD-10-CM

## 2021-07-19 LAB — URINALYSIS, ROUTINE W REFLEX MICROSCOPIC
Bilirubin Urine: NEGATIVE
Glucose, UA: NEGATIVE mg/dL
Ketones, ur: NEGATIVE mg/dL
Nitrite: NEGATIVE
Protein, ur: 30 mg/dL — AB
RBC / HPF: >50 RBC/hpf — ABNORMAL HIGH (ref 0–5)
Specific Gravity, Urine: 1.03 (ref 1.005–1.030)
WBC, UA: >50 WBC/hpf — ABNORMAL HIGH (ref 0–5)
pH: 5 (ref 5.0–8.0)

## 2021-07-19 NOTE — MAU Provider Note (Signed)
Chief Complaint:  Vaginal Bleeding   Event Date/Time   First Provider Initiated Contact with Patient 07/19/21 2057     HPI: Molly Sims is a 35 y.o. G2P0101 at 72w0dwho presents to maternity admissions reporting pink spotting twice today. Has never had bleeding before.  No pain or cramping. . She reports good fetal movement, denies LOF, vaginal itching/burning, urinary symptoms, h/a, dizziness, n/v, diarrhea, constipation or fever/chills.    Vaginal Bleeding The patient's primary symptoms include vaginal bleeding. The patient's pertinent negatives include no genital itching, genital lesions, genital odor or pelvic pain. This is a new problem. The current episode started today. The problem occurs intermittently. The problem has been unchanged. The patient is experiencing no pain. She is pregnant. Pertinent negatives include no abdominal pain, back pain, chills, dysuria, fever or frequency. The vaginal discharge was bloody. The vaginal bleeding is spotting. She has not been passing clots. She has not been passing tissue. Nothing aggravates the symptoms. She has tried nothing for the symptoms.   RN Note: Has had pink spotting twice today and wanted to be sure everything is ok. No pain. Good FM. No recent intercourse.    Past Medical History: Past Medical History:  Diagnosis Date   Anemia     Past obstetric history: OB History  Gravida Para Term Preterm AB Living  $Remov'2 1   1   1  'tWORmY$ SAB IAB Ectopic Multiple Live Births          1    # Outcome Date GA Lbr Len/2nd Weight Sex Delivery Anes PTL Lv  2 Current           1 Preterm 05/13/07 [redacted]w[redacted]d   M Vag-Spont   LIV    Past Surgical History: Past Surgical History:  Procedure Laterality Date   WISDOM TOOTH EXTRACTION      Family History: Family History  Problem Relation Age of Onset   Hypertension Mother     Social History: Social History   Tobacco Use   Smoking status: Never   Smokeless tobacco: Never  Vaping Use   Vaping Use:  Never used  Substance Use Topics   Alcohol use: Not Currently    Comment: Not since confirmed pregnancy   Drug use: Yes    Types: Marijuana    Allergies: No Known Allergies  Meds:  Medications Prior to Admission  Medication Sig Dispense Refill Last Dose   ferrous sulfate 325 (65 FE) MG tablet Take 1 tablet (325 mg total) by mouth every other day. 60 tablet 5 07/18/2021   Prenat-Fe Poly-Methfol-FA-DHA (VITAFOL ULTRA) 29-0.6-0.4-200 MG CAPS Take 1 capsule by mouth daily before breakfast. 90 capsule 4 07/19/2021   Blood Pressure Monitoring (BLOOD PRESSURE KIT) DEVI 1 kit by Does not apply route once a week. (Patient not taking: Reported on 04/08/2021) 1 each 0     I have reviewed patient's Past Medical Hx, Surgical Hx, Family Hx, Social Hx, medications and allergies.   ROS:  Review of Systems  Constitutional:  Negative for chills and fever.  Gastrointestinal:  Negative for abdominal pain.  Genitourinary:  Positive for vaginal bleeding. Negative for dysuria, frequency and pelvic pain.  Musculoskeletal:  Negative for back pain.  Other systems negative  Physical Exam  Patient Vitals for the past 24 hrs:  BP Temp Temp src Pulse Resp SpO2 Height Weight  07/19/21 2016 (!) 132/56 98.7 F (37.1 C) Oral 98 18 99 % $Re'5\' 2"'WZj$  (1.575 m) 122.5 kg  07/19/21 1936 (!) 158/72 -- -- -- -- -- -- --  07/19/21 1929 (!) 154/77 -- -- (!) 102 -- 100 % -- --  07/19/21 1928 -- 98.7 F (37.1 C) -- -- 17 -- $Rem'5\' 2"'NrNC$  (1.575 m) 122.5 kg   Vitals:   07/19/21 2035 07/19/21 2045 07/19/21 2110 07/19/21 2130  BP: 131/61 124/66 123/71 114/71  Pulse: 98 97 90 91  Resp:      Temp:      TempSrc:      SpO2: 99% 100% 100% 100%  Weight:      Height:       114/71  Constitutional: Well-developed, well-nourished female in no acute distress.  Cardiovascular: normal rate and rhythm Respiratory: normal effort, clear to auscultation bilaterally GI: Abd soft, non-tender, gravid appropriate for gestational age.   No rebound or  guarding. MS: Extremities nontender, no edema, normal ROM Neurologic: Alert and oriented x 4.  GU: Neg CVAT.  PELVIC EXAM: Cervix pink, visually closed, without lesion, scant red thin discharge Digital exam done of cervix, and IUD was palpable just inside the external os   This had not been easily visible on speculum exam  This was removed easily with ring forceps.    FHT:  Baseline 140 , moderate variability, accelerations present, no decelerations Contractions: none   Labs: Results for orders placed or performed during the hospital encounter of 07/19/21 (from the past 24 hour(s))  Urinalysis, Routine w reflex microscopic Urine, Clean Catch     Status: Abnormal   Collection Time: 07/19/21  7:40 PM  Result Value Ref Range   Color, Urine YELLOW YELLOW   APPearance CLOUDY (A) CLEAR   Specific Gravity, Urine 1.030 1.005 - 1.030   pH 5.0 5.0 - 8.0   Glucose, UA NEGATIVE NEGATIVE mg/dL   Hgb urine dipstick LARGE (A) NEGATIVE   Bilirubin Urine NEGATIVE NEGATIVE   Ketones, ur NEGATIVE NEGATIVE mg/dL   Protein, ur 30 (A) NEGATIVE mg/dL   Nitrite NEGATIVE NEGATIVE   Leukocytes,Ua MODERATE (A) NEGATIVE   RBC / HPF >50 (H) 0 - 5 RBC/hpf   WBC, UA >50 (H) 0 - 5 WBC/hpf   Bacteria, UA RARE (A) NONE SEEN   Squamous Epithelial / LPF 11-20 0 - 5   Mucus PRESENT    O/Positive/-- (05/27 1011)  Imaging:    MAU Course/MDM: IUD removed from cervix as described above NST reviewed, reassuring for gestational age.  Two elevated BPs noted at beginning of visit, but remainder are low Consult Dr Roselie Awkward with presentation, exam findings  He states nothing more to do at this point since IUD removed Treatments in MAU included IUD removal.    Assessment: Single IUP at [redacted]w[redacted]d Vaginal bleeding in the third trimester Retained IUD in cervix, removed  Plan: Discharge home Bleeding precautions. Reviewed she will likely bleed for a few more days.   Preterm Labor precautions and fetal kick counts Follow  up in Office for prenatal visits  Drs Donalee Citrin and Jodi Mourning emailed with findings.  Encouraged to return if she develops worsening of symptoms, increase in pain, fever, or other concerning symptoms.   Pt stable at time of discharge.  Hansel Feinstein CNM, MSN Certified Nurse-Midwife 07/19/2021 8:57 PM

## 2021-07-19 NOTE — MAU Note (Signed)
Has had pink spotting twice today and wanted to be sure everything is ok. No pain. Good FM. No recent intercourse.

## 2021-07-20 DIAGNOSIS — Z720 Tobacco use: Secondary | ICD-10-CM | POA: Insufficient documentation

## 2021-07-20 DIAGNOSIS — O2633 Retained intrauterine contraceptive device in pregnancy, third trimester: Secondary | ICD-10-CM

## 2021-08-02 ENCOUNTER — Ambulatory Visit: Payer: Medicaid Other

## 2021-08-08 ENCOUNTER — Encounter: Payer: Self-pay | Admitting: *Deleted

## 2021-08-08 ENCOUNTER — Other Ambulatory Visit: Payer: Self-pay | Admitting: *Deleted

## 2021-08-08 ENCOUNTER — Ambulatory Visit (HOSPITAL_BASED_OUTPATIENT_CLINIC_OR_DEPARTMENT_OTHER): Payer: Medicaid Other

## 2021-08-08 ENCOUNTER — Other Ambulatory Visit: Payer: Self-pay

## 2021-08-08 ENCOUNTER — Ambulatory Visit: Payer: Medicaid Other | Attending: Maternal & Fetal Medicine | Admitting: *Deleted

## 2021-08-08 DIAGNOSIS — O368330 Maternal care for abnormalities of the fetal heart rate or rhythm, third trimester, not applicable or unspecified: Secondary | ICD-10-CM | POA: Insufficient documentation

## 2021-08-08 DIAGNOSIS — Z6841 Body Mass Index (BMI) 40.0 and over, adult: Secondary | ICD-10-CM

## 2021-08-08 DIAGNOSIS — O99213 Obesity complicating pregnancy, third trimester: Secondary | ICD-10-CM | POA: Diagnosis not present

## 2021-08-08 DIAGNOSIS — Z3480 Encounter for supervision of other normal pregnancy, unspecified trimester: Secondary | ICD-10-CM

## 2021-08-08 DIAGNOSIS — Z3A32 32 weeks gestation of pregnancy: Secondary | ICD-10-CM

## 2021-08-08 DIAGNOSIS — E669 Obesity, unspecified: Secondary | ICD-10-CM | POA: Diagnosis not present

## 2021-08-08 DIAGNOSIS — O2633 Retained intrauterine contraceptive device in pregnancy, third trimester: Secondary | ICD-10-CM

## 2021-08-22 ENCOUNTER — Other Ambulatory Visit: Payer: Medicaid Other

## 2021-08-22 ENCOUNTER — Other Ambulatory Visit: Payer: Self-pay

## 2021-08-22 ENCOUNTER — Inpatient Hospital Stay (HOSPITAL_COMMUNITY)
Admission: AD | Admit: 2021-08-22 | Discharge: 2021-08-26 | DRG: 806 | Disposition: A | Discharge status: Home / Self Care | Payer: Medicaid Other | Attending: Family Medicine | Admitting: Family Medicine

## 2021-08-22 ENCOUNTER — Encounter (HOSPITAL_COMMUNITY): Payer: Self-pay | Admitting: Family Medicine

## 2021-08-22 DIAGNOSIS — O99324 Drug use complicating childbirth: Secondary | ICD-10-CM | POA: Diagnosis present

## 2021-08-22 DIAGNOSIS — O2633 Retained intrauterine contraceptive device in pregnancy, third trimester: Secondary | ICD-10-CM

## 2021-08-22 DIAGNOSIS — Z3A34 34 weeks gestation of pregnancy: Secondary | ICD-10-CM | POA: Diagnosis not present

## 2021-08-22 DIAGNOSIS — O42913 Preterm premature rupture of membranes, unspecified as to length of time between rupture and onset of labor, third trimester: Principal | ICD-10-CM | POA: Diagnosis present

## 2021-08-22 DIAGNOSIS — Z3480 Encounter for supervision of other normal pregnancy, unspecified trimester: Secondary | ICD-10-CM

## 2021-08-22 DIAGNOSIS — Z20822 Contact with and (suspected) exposure to covid-19: Secondary | ICD-10-CM | POA: Diagnosis present

## 2021-08-22 DIAGNOSIS — O99210 Obesity complicating pregnancy, unspecified trimester: Secondary | ICD-10-CM

## 2021-08-22 DIAGNOSIS — F1290 Cannabis use, unspecified, uncomplicated: Secondary | ICD-10-CM | POA: Diagnosis present

## 2021-08-22 DIAGNOSIS — O99214 Obesity complicating childbirth: Secondary | ICD-10-CM | POA: Diagnosis present

## 2021-08-22 DIAGNOSIS — R030 Elevated blood-pressure reading, without diagnosis of hypertension: Secondary | ICD-10-CM | POA: Diagnosis not present

## 2021-08-22 DIAGNOSIS — O99893 Other specified diseases and conditions complicating puerperium: Secondary | ICD-10-CM | POA: Diagnosis not present

## 2021-08-22 DIAGNOSIS — Z3A35 35 weeks gestation of pregnancy: Secondary | ICD-10-CM | POA: Diagnosis not present

## 2021-08-22 DIAGNOSIS — O42919 Preterm premature rupture of membranes, unspecified as to length of time between rupture and onset of labor, unspecified trimester: Secondary | ICD-10-CM | POA: Diagnosis present

## 2021-08-22 DIAGNOSIS — O42013 Preterm premature rupture of membranes, onset of labor within 24 hours of rupture, third trimester: Secondary | ICD-10-CM | POA: Diagnosis not present

## 2021-08-22 LAB — CBC
HCT: 30.6 % — ABNORMAL LOW (ref 36.0–46.0)
Hemoglobin: 9.9 g/dL — ABNORMAL LOW (ref 12.0–15.0)
MCH: 29 pg (ref 26.0–34.0)
MCHC: 32.4 g/dL (ref 30.0–36.0)
MCV: 89.7 fL (ref 80.0–100.0)
Platelets: 308 10*3/uL (ref 150–400)
RBC: 3.41 MIL/uL — ABNORMAL LOW (ref 3.87–5.11)
RDW: 13.7 % (ref 11.5–15.5)
WBC: 12.9 10*3/uL — ABNORMAL HIGH (ref 4.0–10.5)
nRBC: 0 % (ref 0.0–0.2)

## 2021-08-22 LAB — URINALYSIS, ROUTINE W REFLEX MICROSCOPIC
Bacteria, UA: NONE SEEN
Bilirubin Urine: NEGATIVE
Glucose, UA: NEGATIVE mg/dL
Hgb urine dipstick: NEGATIVE
Ketones, ur: 20 mg/dL — AB
Nitrite: NEGATIVE
Protein, ur: 30 mg/dL — AB
Specific Gravity, Urine: 1.023 (ref 1.005–1.030)
WBC, UA: >50 WBC/hpf — ABNORMAL HIGH (ref 0–5)
pH: 5 (ref 5.0–8.0)

## 2021-08-22 LAB — HEMOGLOBIN A1C
Hgb A1c MFr Bld: 5.1 % (ref 4.8–5.6)
Mean Plasma Glucose: 99.67 mg/dL

## 2021-08-22 LAB — POCT FERN TEST: POCT Fern Test: POSITIVE

## 2021-08-22 LAB — RESP PANEL BY RT-PCR (FLU A&B, COVID) ARPGX2
Influenza A by PCR: NEGATIVE
Influenza B by PCR: NEGATIVE
SARS Coronavirus 2 by RT PCR: NEGATIVE

## 2021-08-22 LAB — TYPE AND SCREEN
ABO/RH(D): O POS
Antibody Screen: NEGATIVE

## 2021-08-22 LAB — AMNISURE RUPTURE OF MEMBRANE (ROM) NOT AT ARMC: Amnisure ROM: POSITIVE

## 2021-08-22 MED ORDER — LACTATED RINGERS IV SOLN: 500.0000 mL | INTRAVENOUS | Status: DC | PRN

## 2021-08-22 MED ORDER — MISOPROSTOL 50MCG HALF TABLET: 50.0000 ug | ORAL_TABLET | ORAL | Status: DC

## 2021-08-22 MED ORDER — SOD CITRATE-CITRIC ACID 500-334 MG/5ML PO SOLN: 30.0000 mL | ORAL | Status: DC | PRN

## 2021-08-22 MED ORDER — OXYCODONE-ACETAMINOPHEN 5-325 MG PO TABS: 2.0000 | ORAL_TABLET | ORAL | Status: DC | PRN

## 2021-08-22 MED ORDER — ONDANSETRON HCL 4 MG/2ML IJ SOLN: 4.0000 mg | Freq: Four times a day (QID) | INTRAMUSCULAR | Status: DC | PRN

## 2021-08-22 MED ORDER — LACTATED RINGERS IV SOLN: INTRAVENOUS | Status: DC

## 2021-08-22 MED ORDER — MISOPROSTOL 50MCG HALF TABLET
ORAL_TABLET | ORAL | Status: AC
  Administered 2021-08-23: 50 ug via BUCCAL
  Filled 2021-08-22: qty 1

## 2021-08-22 MED ORDER — OXYTOCIN-SODIUM CHLORIDE 30-0.9 UT/500ML-% IV SOLN
2.5000 [IU]/h | INTRAVENOUS | Status: DC
Start: 2021-08-22 — End: 2021-08-24
  Administered 2021-08-24: 2.5 [IU]/h via INTRAVENOUS
  Filled 2021-08-22: qty 500

## 2021-08-22 MED ORDER — OXYCODONE-ACETAMINOPHEN 5-325 MG PO TABS: 1.0000 | ORAL_TABLET | ORAL | Status: DC | PRN

## 2021-08-22 MED ORDER — PENICILLIN G POT IN DEXTROSE 60000 UNIT/ML IV SOLN
3.0000 10*6.[IU] | INTRAVENOUS | Status: DC
  Administered 2021-08-22 – 2021-08-24 (×8): 3 10*6.[IU] via INTRAVENOUS
  Filled 2021-08-22 (×8): qty 50

## 2021-08-22 MED ORDER — LIDOCAINE HCL (PF) 1 % IJ SOLN: 30.0000 mL | INTRAMUSCULAR | Status: DC | PRN

## 2021-08-22 MED ORDER — OXYTOCIN BOLUS FROM INFUSION
333.0000 mL | Freq: Once | INTRAVENOUS | Status: AC
  Administered 2021-08-24: 333 mL via INTRAVENOUS

## 2021-08-22 MED ORDER — SODIUM CHLORIDE 0.9 % IV SOLN
5.0000 10*6.[IU] | Freq: Once | INTRAVENOUS | Status: AC
  Administered 2021-08-22: 5 10*6.[IU] via INTRAVENOUS
  Filled 2021-08-22: qty 5

## 2021-08-22 MED ORDER — MISOPROSTOL 50MCG HALF TABLET
50.0000 ug | ORAL_TABLET | Freq: Once | ORAL | Status: AC
  Administered 2021-08-22: 50 ug via BUCCAL

## 2021-08-22 MED ORDER — TERBUTALINE SULFATE 1 MG/ML IJ SOLN: 0.2500 mg | Freq: Once | INTRAMUSCULAR | Status: DC | PRN

## 2021-08-22 MED ORDER — ACETAMINOPHEN 325 MG PO TABS: 650.0000 mg | ORAL_TABLET | ORAL | Status: DC | PRN

## 2021-08-22 MED ORDER — MISOPROSTOL 50MCG HALF TABLET
50.0000 ug | ORAL_TABLET | ORAL | Status: DC
  Administered 2021-08-22 – 2021-08-23 (×2): 50 ug via BUCCAL
  Filled 2021-08-22 (×4): qty 1

## 2021-08-22 NOTE — H&P (Addendum)
OBSTETRIC ADMISSION HISTORY AND PHYSICAL  Molly Sims is a 35 y.o. female G6P0101 with IUP at 61w6dby U/S presenting for spontaneous rupture of membranes. She reports +Fms and leakage of fluid. No VB, no blurry vision, headaches or peripheral edema, and RUQ pain. She plans on bottle feeding. She does not have interest in any birth control measures. She received her prenatal care at [facility]  Dating: By 11w U/S --->  Estimated Date of Delivery: 09/27/21  Sono:  _0 , CWD, normal anatomy with exception of absent nasal bone, cephalic presentation, anterior placenta, 1936g, 23% EFW  Prenatal History/Complications:  - limited prenatal care - severe morbid Obesity; 526BMI - retained IUD during pregnancy  Past Medical History: Past Medical History:  Diagnosis Date   Anemia     Past Surgical History: Past Surgical History:  Procedure Laterality Date   WISDOM TOOTH EXTRACTION      Obstetrical History: OB History     Gravida  2   Para  1   Term  1   Preterm  0   AB      Living  1      SAB      IAB      Ectopic      Multiple      Live Births  1           Social History Social History   Socioeconomic History   Marital status: Single    Spouse name: Not on file   Number of children: Not on file   Years of education: Not on file   Highest education level: Not on file  Occupational History   Not on file  Tobacco Use   Smoking status: Never   Smokeless tobacco: Never  Vaping Use   Vaping Use: Never used  Substance and Sexual Activity   Alcohol use: Not Currently    Comment: Not since confirmed pregnancy   Drug use: Yes    Types: Marijuana   Sexual activity: Yes    Partners: Male    Birth control/protection: None  Other Topics Concern   Not on file  Social History Narrative   Not on file   Social Determinants of Health   Financial Resource Strain: Not on file  Food Insecurity: Not on file  Transportation Needs: Not on file  Physical  Activity: Not on file  Stress: Not on file  Social Connections: Not on file    Family History: Family History  Problem Relation Age of Onset   Hypertension Mother     Allergies: No Known Allergies  Medications Prior to Admission  Medication Sig Dispense Refill Last Dose   Prenat-Fe Poly-Methfol-FA-DHA (VITAFOL ULTRA) 29-0.6-0.4-200 MG CAPS Take 1 capsule by mouth daily before breakfast. 90 capsule 4 08/21/2021   Blood Pressure Monitoring (BLOOD PRESSURE KIT) DEVI 1 kit by Does not apply route once a week. (Patient not taking: Reported on 04/08/2021) 1 each 0    ferrous sulfate 325 (65 FE) MG tablet Take 1 tablet (325 mg total) by mouth every other day. 60 tablet 5 08/20/2021   Review of Systems   All systems reviewed and negative except as stated in HPI  Blood pressure 96/77, pulse 97, temperature 98.9 F (37.2 C), temperature source Oral, resp. rate 16, height _1  (1.575 m), weight 126.9 kg, last menstrual period 12/30/2020, SpO2 100 %. General appearance: alert, cooperative, and appears stated age Lungs: clear to auscultation bilaterally Heart: regular rate and rhythm Abdomen: non-tender Extremities:  no sign of DVT Presentation: unsure Fetal monitoringBaseline: 145 bpm, Variability: Good {> 6 bpm), Accelerations: Reactive, Decelerations: Absent, and Category I Uterine activity: none   Prenatal labs: ABO, Rh: --/--/O POS (10/10 1615) Antibody: NEG (10/10 1615) Rubella: 1.13 (05/27 1011) RPR: Non Reactive (05/27 1011)  HBsAg: Negative (05/27 1011)  HIV: Non Reactive (05/27 1011)  GBS:   unknown > PCN GTT not performed Genetic screening  not performed Anatomy US: absent nasal bone previously seen  Prenatal Transfer Tool  Maternal Diabetes: Not performed Genetic Screening: Declined Maternal Ultrasounds/Referrals: Absent nasal bone Fetal Ultrasounds or other Referrals:  Referred to Materal Fetal Medicine  Maternal Substance Abuse:  No Significant Maternal Medications:   Meds include: Other: iron and prenatal vitamins Significant Maternal Lab Results: None  Results for orders placed or performed during the hospital encounter of 08/22/21 (from the past 24 hour(s))  Urinalysis, Routine w reflex microscopic Urine, Clean Catch   Collection Time: 08/22/21  2:59 PM  Result Value Ref Range   Color, Urine YELLOW YELLOW   APPearance CLOUDY (A) CLEAR   Specific Gravity, Urine 1.023 1.005 - 1.030   pH 5.0 5.0 - 8.0   Glucose, UA NEGATIVE NEGATIVE mg/dL   Hgb urine dipstick NEGATIVE NEGATIVE   Bilirubin Urine NEGATIVE NEGATIVE   Ketones, ur 20 (A) NEGATIVE mg/dL   Protein, ur 30 (A) NEGATIVE mg/dL   Nitrite NEGATIVE NEGATIVE   Leukocytes,Ua MODERATE (A) NEGATIVE   RBC / HPF 0-5 0 - 5 RBC/hpf   WBC, UA >50 (H) 0 - 5 WBC/hpf   Bacteria, UA NONE SEEN NONE SEEN   Squamous Epithelial / LPF 21-50 0 - 5   Mucus PRESENT   Amnisure rupture of membrane (rom)not at [facility]   Collection Time: 08/22/21  3:36 PM  Result Value Ref Range   Amnisure ROM POSITIVE   Fern Test   Collection Time: 08/22/21  3:49 PM  Result Value Ref Range   POCT Fern Test Positive = ruptured amniotic membanes   Type and screen [place]   Collection Time: 08/22/21  4:15 PM  Result Value Ref Range   ABO/RH(D) O POS    Antibody Screen NEG    Sample Expiration      08/25/2021,2359 Performed at [facility], [address] [place], [place] [address]     Patient Active Problem List   Diagnosis Date Noted   Preterm premature rupture of membranes (PPROM) with unknown onset of labor 08/22/2021   Tobacco abuse 07/20/2021   Retained intrauterine device (IUD) during pregnancy in third trimester 07/20/2021   Morbid obesity ([place]) 06/06/2021   Supervision of other normal pregnancy, antepartum 03/14/2021   Assessment/Plan:  Molly Sims is a 35 y.o. G2P0101 at 44w6dhere for spontaneous rupture of membranes.  #Labor: cervical exam 2.5 and thick. Cytotec 560m buccal.   #Pain: none at this time. Tylenol prn. #Fetal Well Being: Category I #ID: GBS unknown > PCN #Method Of Feeding: bottle #Method Of Contraception:  undecided #Circ: N/A  DaWells GuilesDO 08/22/2021, 5:46 PM PGY-1, [facility] RanchMidwife attestation: I have seen and examined this patient; I agree with above documentation in the resident's note.   PE: Gen: calm comfortable, NAD Resp: normal effort and rate Abd: gravid  ROS, labs, PMH reviewed  Assessment/Plan: [redacted] weeks gestation Labor: PROM FWB: Cat I GBS: unknown Admit to LD PCN Cytotec  NICU consult  MeJulianne HandlerCNM  08/22/2021, 7:03 PM

## 2021-08-22 NOTE — MAU Note (Signed)
Presents stating she's been leaking fluid since 1210 this afternoon, fluid clear.  Denies VB.  Endorses +FM.

## 2021-08-22 NOTE — MAU Provider Note (Signed)
Event Date/Time   First Provider Initiated Contact with Patient 08/22/21 1531     S: Ms. Molly Sims is a 35 y.o. G2P0101 at [redacted]w[redacted]d  who presents to MAU today complaining of leaking of fluid since 1200.  She denies vaginal bleeding. She denies contractions. She reports normal fetal movement.    O: BP 124/72 (BP Location: Right Arm)   Pulse 86   Temp 98.2 F (36.8 C) (Oral)   Resp 19   Ht 5\' 2"  (1.575 m)   Wt 126.9 kg   LMP 12/30/2020   SpO2 100%   BMI 51.18 kg/m  GENERAL: Well-developed, well-nourished female in no acute distress.  HEAD: Normocephalic, atraumatic.  CHEST: Normal effort of breathing, regular heart rate ABDOMEN: Soft, nontender, gravid PELVIC: Normal external female genitalia. Vagina is pink and rugated. Cervix with normal contour, no lesions. Normal discharge.  + pooling.   Cervical exam:   Deferred   Fetal Monitoring: Baseline: 125 bpm Variability: Moderate  Accelerations: 15x15 Decelerations: None Contractions: None  Results for orders placed or performed during the hospital encounter of 08/22/21 (from the past 24 hour(s))  Urinalysis, Routine w reflex microscopic Urine, Clean Catch     Status: Abnormal   Collection Time: 08/22/21  2:59 PM  Result Value Ref Range   Color, Urine YELLOW YELLOW   APPearance CLOUDY (A) CLEAR   Specific Gravity, Urine 1.023 1.005 - 1.030   pH 5.0 5.0 - 8.0   Glucose, UA NEGATIVE NEGATIVE mg/dL   Hgb urine dipstick NEGATIVE NEGATIVE   Bilirubin Urine NEGATIVE NEGATIVE   Ketones, ur 20 (A) NEGATIVE mg/dL   Protein, ur 30 (A) NEGATIVE mg/dL   Nitrite NEGATIVE NEGATIVE   Leukocytes,Ua MODERATE (A) NEGATIVE   RBC / HPF 0-5 0 - 5 RBC/hpf   WBC, UA >50 (H) 0 - 5 WBC/hpf   Bacteria, UA NONE SEEN NONE SEEN   Squamous Epithelial / LPF 21-50 0 - 5   Mucus PRESENT    Pt informed that the ultrasound is considered a limited OB ultrasound and is not intended to be a complete ultrasound exam.  Patient also informed that the  ultrasound is not being completed with the intent of assessing for fetal or placental anomalies or any pelvic abnormalities.  Explained that the purpose of today's ultrasound is to assess for  viability.  Patient acknowledges the purpose of the exam and the limitations of the study.      A: SIUP at [redacted]w[redacted]d  SROM PPROM  P:   Fern slide positive Amnisure positive Vertex position on [redacted]w[redacted]d GBS collected Report given to [place] CNM who resumes care of the patient  Lu Paradise, Donette Larry, NP 08/22/2021 4:10 PM

## 2021-08-22 NOTE — Progress Notes (Signed)
Labor Progress Note Molly Sims is a 35 y.o. G2P1001 at [redacted]w[redacted]d presented for PPROM  S:  Patient comfortable, denies any pain  O:  BP 113/77 (BP Location: Left Arm)   Pulse 92   Temp 99.4 F (37.4 C) (Oral)   Resp 18   Ht 5\' 2"  (1.575 m)   Wt 126.9 kg   LMP 12/30/2020   SpO2 100%   BMI 51.18 kg/m   Fetal Tracing:  Baseline: 145 Variability: moderate Accels: 15x15 Decels: none  Toco: occasional uc's   CVE: Dilation: 2.5 Effacement (%): Thick Presentation: Vertex Exam by:: [email or web address] CNM   [name]: 35 y.o. G2P1001 [redacted]w[redacted]d PPROM #Labor: Continue cytotec #Pain: per patient request #FWB: Cat 1 #GBS  pending, PCN  [redacted]w[redacted]d, CNM 9:41 PM

## 2021-08-23 ENCOUNTER — Inpatient Hospital Stay (HOSPITAL_COMMUNITY): Payer: Medicaid Other | Admitting: Anesthesiology

## 2021-08-23 LAB — RPR: RPR Ser Ql: NONREACTIVE

## 2021-08-23 MED ORDER — EPHEDRINE 5 MG/ML INJ: 10.0000 mg | INTRAVENOUS | Status: DC | PRN

## 2021-08-23 MED ORDER — PHENYLEPHRINE 40 MCG/ML (10ML) SYRINGE FOR IV PUSH (FOR BLOOD PRESSURE SUPPORT): 80.0000 ug | PREFILLED_SYRINGE | INTRAVENOUS | Status: DC | PRN

## 2021-08-23 MED ORDER — LACTATED RINGERS IV SOLN: 500.0000 mL | Freq: Once | INTRAVENOUS | Status: DC

## 2021-08-23 MED ORDER — OXYTOCIN-SODIUM CHLORIDE 30-0.9 UT/500ML-% IV SOLN
1.0000 m[IU]/min | INTRAVENOUS | Status: DC
  Administered 2021-08-23: 2 m[IU]/min via INTRAVENOUS
  Administered 2021-08-24: 10 m[IU]/min via INTRAVENOUS

## 2021-08-23 MED ORDER — LIDOCAINE HCL (PF) 1 % IJ SOLN
INTRAMUSCULAR | Status: DC | PRN
  Administered 2021-08-23: 8 mL via EPIDURAL

## 2021-08-23 MED ORDER — DIPHENHYDRAMINE HCL 50 MG/ML IJ SOLN: 12.5000 mg | INTRAMUSCULAR | Status: DC | PRN

## 2021-08-23 MED ORDER — FENTANYL CITRATE (PF) 100 MCG/2ML IJ SOLN
50.0000 ug | INTRAMUSCULAR | Status: DC | PRN
  Administered 2021-08-23: 50 ug via INTRAVENOUS
  Filled 2021-08-23: qty 2

## 2021-08-23 MED ORDER — FENTANYL-BUPIVACAINE-NACL 0.5-0.125-0.9 MG/250ML-% EP SOLN
12.0000 mL/h | EPIDURAL | Status: DC | PRN
  Administered 2021-08-23: 12 mL/h via EPIDURAL
  Filled 2021-08-23: qty 250

## 2021-08-23 NOTE — Progress Notes (Signed)
Labor Progress Note Molly Sims is a 35 y.o. G2P1001 at [redacted]w[redacted]d presented for PPROM.   S: Doing well. Walking around the room feel better than lying in bed.   O:  BP 117/67   Pulse 89   Temp 98.7 F (37.1 C) (Oral)   Resp 16   Ht 5\' 2"  (1.575 m)   Wt 126.9 kg   LMP 12/30/2020   SpO2 98%   BMI 51.18 kg/m  EFM: 140 bpm/10x10/difficult tracing  CVE: Dilation: 5 Effacement (%): 80 Cervical Position: Posterior Station: -3 Presentation: Vertex Exam by:: [email or web address], RN   [name]: 35 y.o. G2P1001 [redacted]w[redacted]d PPROM.  #Labor: Progressing well. S/P FB, cytotec. On pitocin. ROM> 24 hours. Maternal temp 98.7 most recently #Pain: Maternally supported, IV pain medication #FWB: Cat I, difficult tracing with good variability #GBS not done, PCN ppx  Pranit Owensby Autry-Lott, DO 10:00 PM

## 2021-08-23 NOTE — Anesthesia Preprocedure Evaluation (Signed)
Anesthesia Evaluation  Patient identified by MRN, date of birth, ID band Patient awake    Reviewed: Allergy & Precautions, NPO status , Patient's Chart, lab work & pertinent test results  Airway Mallampati: III  TM Distance: >3 FB Neck ROM: Full    Dental no notable dental hx.    Pulmonary neg pulmonary ROS,    Pulmonary exam normal breath sounds clear to auscultation       Cardiovascular negative cardio ROS Normal cardiovascular exam Rhythm:Regular Rate:Normal     Neuro/Psych negative neurological ROS  negative psych ROS   GI/Hepatic negative GI ROS, (+)     substance abuse  marijuana use,   Endo/Other  Morbid obesity  Renal/GU negative Renal ROS  negative genitourinary   Musculoskeletal negative musculoskeletal ROS (+)   Abdominal   Peds negative pediatric ROS (+)  Hematology negative hematology ROS (+) anemia ,   Anesthesia Other Findings   Reproductive/Obstetrics negative OB ROS                             Anesthesia Physical Anesthesia Plan  ASA: 4  Anesthesia Plan: Epidural   Post-op Pain Management:    Induction:   PONV Risk Score and Plan: 2 and Treatment may vary due to age or medical condition  Airway Management Planned: Natural Airway  Additional Equipment:   Intra-op Plan:   Post-operative Plan:   Informed Consent: I have reviewed the patients History and Physical, chart, labs and discussed the procedure including the risks, benefits and alternatives for the proposed anesthesia with the patient or authorized representative who has indicated his/her understanding and acceptance.       Plan Discussed with: Anesthesiologist  Anesthesia Plan Comments:         Anesthesia Quick Evaluation

## 2021-08-23 NOTE — Anesthesia Procedure Notes (Signed)
Epidural Patient location during procedure: OB Start time: 08/23/2021 10:09 PM End time: 08/23/2021 10:20 PM  Staffing Anesthesiologist: Mellody Dance, MD Performed: anesthesiologist   Preanesthetic Checklist Completed: patient identified, IV checked, site marked, risks and benefits discussed, monitors and equipment checked, pre-op evaluation and timeout performed  Epidural Patient position: sitting Prep: DuraPrep Patient monitoring: heart rate, cardiac monitor, continuous pulse ox and blood pressure Approach: midline Location: L2-L3 Injection technique: LOR saline  Needle:  Needle type: Tuohy  Needle gauge: 17 G Needle length: 9 cm Needle insertion depth: 9 cm Catheter type: closed end flexible Catheter size: 20 Guage Catheter at skin depth: 15 cm Test dose: negative and Other  Assessment Events: blood not aspirated, injection not painful, no injection resistance and negative IV test  Additional Notes Informed consent obtained prior to proceeding including risk of failure, 1% risk of PDPH, risk of minor discomfort and bruising.  Discussed rare but serious complications including epidural abscess, permanent nerve injury, epidural hematoma.  Discussed alternatives to epidural analgesia and patient desires to proceed.  Timeout performed pre-procedure verifying patient name, procedure, and platelet count.  Patient tolerated procedure well.

## 2021-08-23 NOTE — Progress Notes (Signed)
Labor Progress Note Molly Sims is a 35 y.o. G2P1001 at [redacted]w[redacted]d presented for PPROM at 35 weeks.   S: Doing well, not feeling much contractions   O:  BP (!) 117/97   Pulse 97   Temp 98.8 F (37.1 C) (Axillary)   Resp 16   Ht 5\' 2"  (1.575 m)   Wt 126.9 kg   LMP 12/30/2020   SpO2 98%   BMI 51.18 kg/m  EFM: 135/mod/15x15/none   CVE: Dilation: 2 Effacement (%): 30 Cervical Position: Posterior Station: Ballotable Presentation: Vertex Exam by:: Dr. [email or web address]   [name]: 35 y.o. G2P1001 [redacted]w[redacted]d  #Labor: Minimal progression since last check, now s/p cytotec x3. SROM for approximately 24 hours at this point, fortunately no s/sx of infection. After verbal consent, placed FB. Pt tolerated well. Given additional buccal cytotec. #Pain: PRN  #FWB: Cat 1  #GBS  pending, PCN   [redacted]w[redacted]d, DO 2:24 PM

## 2021-08-23 NOTE — Progress Notes (Signed)
Labor Progress Note Molly Sims is a 35 y.o. G2P1001 at [redacted]w[redacted]d presented for PPROM.   S: Doing well overall. FB fell out earlier this afternoon.   O:  BP 116/83   Pulse 93   Temp 99 F (37.2 C) (Axillary)   Resp 16   Ht 5\' 2"  (1.575 m)   Wt 126.9 kg   LMP 12/30/2020   SpO2 98%   BMI 51.18 kg/m  EFM: 140/mod/15x15/none  CVE: Dilation: 5 Effacement (%): 50 Cervical Position: Posterior Station: -3 Presentation: Vertex Exam by:: [email or web address], RN   [name]: 35 y.o. G2P1001 [redacted]w[redacted]d  #Labor: Progressing well now s/p FB and cytotec x4. Will transition to pit 2x2. ROM > 24 hours, no s/sx of infection at this time.  #Pain: PRN  #FWB: Cat 1  #GBS pending, PCN    [redacted]w[redacted]d, DO 5:03 PM

## 2021-08-23 NOTE — Progress Notes (Signed)
Labor Progress Note Molly Sims is a 35 y.o. G2P1001 at [redacted]w[redacted]d presented for PPROM.  S: Doing well, no concerns.   O:  BP (!) 126/57 (BP Location: Left Arm)   Pulse 85   Temp 98.9 F (37.2 C) (Oral)   Resp 18   Ht 5\' 2"  (1.575 m)   Wt 126.9 kg   LMP 12/30/2020   SpO2 100%   BMI 51.18 kg/m   EFM: Baseline 135 bpm, moderate variability, + accels, no decels   CVE: Dilation: 2 Effacement (%): 30 Cervical Position: Posterior Station: Ballotable Presentation: Vertex Exam by:: [email or web address] RNC   [name]: 35 y.o. G2P1001 [redacted]w[redacted]d   #Labor: SVE largely unchanged since last check. Discussed foley balloon placement. Patient would like to defer this for now, open to trying with the next check. Will give 3rd dose of buccal Cytotec and reassess in 4 hours.  #Pain: PRN #FWB: Cat 1  #GBS pending; PCN  [redacted]w[redacted]d, MD 5:03 AM

## 2021-08-24 ENCOUNTER — Encounter (HOSPITAL_COMMUNITY): Payer: Self-pay | Admitting: Family Medicine

## 2021-08-24 DIAGNOSIS — O99210 Obesity complicating pregnancy, unspecified trimester: Secondary | ICD-10-CM

## 2021-08-24 LAB — COMPREHENSIVE METABOLIC PANEL
ALT: 27 U/L (ref 0–44)
AST: 25 U/L (ref 15–41)
Albumin: 2.3 g/dL — ABNORMAL LOW (ref 3.5–5.0)
Alkaline Phosphatase: 105 U/L (ref 38–126)
Anion gap: 8 (ref 5–15)
BUN: <5 mg/dL — ABNORMAL LOW (ref 6–20)
CO2: 19 mmol/L — ABNORMAL LOW (ref 22–32)
Calcium: 8.7 mg/dL — ABNORMAL LOW (ref 8.9–10.3)
Chloride: 107 mmol/L (ref 98–111)
Creatinine, Ser: 0.62 mg/dL (ref 0.44–1.00)
GFR, Estimated: >60 mL/min (ref >60)
Glucose, Bld: 127 mg/dL — ABNORMAL HIGH (ref 70–99)
Potassium: 3.3 mmol/L — ABNORMAL LOW (ref 3.5–5.1)
Sodium: 134 mmol/L — ABNORMAL LOW (ref 135–145)
Total Bilirubin: 0.6 mg/dL (ref 0.3–1.2)
Total Protein: 5.8 g/dL — ABNORMAL LOW (ref 6.5–8.1)

## 2021-08-24 MED ORDER — TETANUS-DIPHTH-ACELL PERTUSSIS 5-2.5-18.5 LF-MCG/0.5 IM SUSY: 0.5000 mL | PREFILLED_SYRINGE | Freq: Once | INTRAMUSCULAR | Status: DC

## 2021-08-24 MED ORDER — WITCH HAZEL-GLYCERIN EX PADS: 1.0000 "application " | MEDICATED_PAD | CUTANEOUS | Status: DC | PRN

## 2021-08-24 MED ORDER — IBUPROFEN 600 MG PO TABS
600.0000 mg | ORAL_TABLET | Freq: Four times a day (QID) | ORAL | Status: DC
  Administered 2021-08-24 – 2021-08-26 (×8): 600 mg via ORAL
  Filled 2021-08-24 (×9): qty 1

## 2021-08-24 MED ORDER — SIMETHICONE 80 MG PO CHEW: 80.0000 mg | CHEWABLE_TABLET | ORAL | Status: DC | PRN

## 2021-08-24 MED ORDER — COCONUT OIL OIL: 1.0000 "application " | TOPICAL_OIL | Status: DC | PRN

## 2021-08-24 MED ORDER — NIFEDIPINE ER OSMOTIC RELEASE 30 MG PO TB24
30.0000 mg | ORAL_TABLET | Freq: Every day | ORAL | Status: DC
  Administered 2021-08-24 – 2021-08-26 (×3): 30 mg via ORAL
  Filled 2021-08-24 (×3): qty 1

## 2021-08-24 MED ORDER — DIPHENHYDRAMINE HCL 25 MG PO CAPS: 25.0000 mg | ORAL_CAPSULE | Freq: Four times a day (QID) | ORAL | Status: DC | PRN

## 2021-08-24 MED ORDER — ACETAMINOPHEN 325 MG PO TABS
650.0000 mg | ORAL_TABLET | ORAL | Status: DC | PRN
  Administered 2021-08-25: 650 mg via ORAL
  Filled 2021-08-24: qty 2

## 2021-08-24 MED ORDER — PRENATAL MULTIVITAMIN CH
1.0000 | ORAL_TABLET | Freq: Every day | ORAL | Status: DC
  Administered 2021-08-24 – 2021-08-26 (×3): 1 via ORAL
  Filled 2021-08-24 (×3): qty 1

## 2021-08-24 MED ORDER — ZOLPIDEM TARTRATE 5 MG PO TABS: 5.0000 mg | ORAL_TABLET | Freq: Every evening | ORAL | Status: DC | PRN

## 2021-08-24 MED ORDER — SENNOSIDES-DOCUSATE SODIUM 8.6-50 MG PO TABS
2.0000 | ORAL_TABLET | Freq: Every day | ORAL | Status: DC
  Administered 2021-08-25 – 2021-08-26 (×2): 2 via ORAL
  Filled 2021-08-24 (×2): qty 2

## 2021-08-24 MED ORDER — DIBUCAINE (PERIANAL) 1 % EX OINT: 1.0000 "application " | TOPICAL_OINTMENT | CUTANEOUS | Status: DC | PRN

## 2021-08-24 MED ORDER — BENZOCAINE-MENTHOL 20-0.5 % EX AERO: 1.0000 "application " | INHALATION_SPRAY | CUTANEOUS | Status: DC | PRN

## 2021-08-24 MED ORDER — ONDANSETRON HCL 4 MG/2ML IJ SOLN: 4.0000 mg | INTRAMUSCULAR | Status: DC | PRN

## 2021-08-24 MED ORDER — ONDANSETRON HCL 4 MG PO TABS: 4.0000 mg | ORAL_TABLET | ORAL | Status: DC | PRN

## 2021-08-24 NOTE — Anesthesia Postprocedure Evaluation (Signed)
Anesthesia Post Note  Patient: Molly Sims  Procedure(s) Performed: AN AD HOC LABOR EPIDURAL     Patient location during evaluation: Mother Baby Anesthesia Type: Epidural Level of consciousness: awake and alert Pain management: pain level controlled Vital Signs Assessment: post-procedure vital signs reviewed and stable Respiratory status: spontaneous breathing, nonlabored ventilation and respiratory function stable Cardiovascular status: stable Postop Assessment: no headache, no backache, epidural receding, no apparent nausea or vomiting, patient able to bend at knees, adequate PO intake and able to ambulate Anesthetic complications: no   No notable events documented.  Last Vitals:  Vitals:   08/24/21 1100 08/24/21 1524  BP: (!) 139/94 123/90  Pulse: 89 88  Resp: 17 18  Temp: 37 C 37 C  SpO2:      Last Pain:  Vitals:   08/24/21 1524  TempSrc: Oral  PainSc:    Pain Goal:                   Laban Emperor

## 2021-08-24 NOTE — Progress Notes (Signed)
Labor Progress Note Molly Sims is a 35 y.o. G2P1001 at [redacted]w[redacted]d who presented for PPROM.  S: Doing well. No concerns.   O:  BP 108/62   Pulse (!) 109   Temp 98 F (36.7 C) (Oral)   Resp 16   Ht 5\' 2"  (1.575 m)   Wt 126.9 kg   LMP 12/30/2020   SpO2 98%   BMI 51.18 kg/m   EFM: Baseline 145 bpm, moderate variability, + accels, variable decels   CVE: Dilation: 5.5 Effacement (%): 80 Cervical Position: Posterior Station: -2 Presentation: Vertex Exam by:: Dr. [email or web address]   [name]: 35 y.o. G2P1001 [redacted]w[redacted]d   #Labor: Pitocin restarted. Currently at 8 milli-units/min. Not yet adequate per IUPC. Contraction pattern improving and baby is tolerating this fairly well. Will continue to up titrate Pitocin. #Pain: Epidural  #FWB: Cat 2 due to variable decels; reassuring variability and continues to have accels. Will continue to monitor.  #GBS pending; PCN  #PPROM: Ruptured on 10/10 around noon. No signs of infection at this time.   12/10, MD 6:29 AM

## 2021-08-24 NOTE — Discharge Summary (Signed)
Postpartum Discharge Summary  Date of Service updated     Patient Name: Molly Sims DOB: 20-Feb-1986 MRN: [id number]  Date of admission: 08/22/2021 Delivery date:08/24/2021  Delivering provider: Gerlene Fee  Date of discharge: 08/26/2021  Admitting diagnosis: Preterm premature rupture of membranes (PPROM) with unknown onset of labor [O42.919] Intrauterine pregnancy: [redacted]w[redacted]d    Secondary diagnosis:  Principal Problem:   Vaginal delivery Active Problems:   Retained intrauterine device (IUD) during pregnancy in third trimester   Preterm premature rupture of membranes (PPROM) with unknown onset of labor   Obesity in pregnancy  Additional problems: None     Discharge diagnosis: Preterm Pregnancy Delivered                                              Post partum procedures: None  Augmentation: Pitocin, Cytotec, and IP Foley Complications: None  Hospital course: Induction of Labor With Vaginal Delivery   35y.o. yo G2P1001 at 341w1das admitted to the hospital 08/22/2021 for induction of labor.  Indication for induction:  PPROM .  Patient had an uncomplicated labor course as follows: Membrane Rupture Time/Date: 12:00 PM ,08/22/2021   Delivery Method:Vaginal, Spontaneous  Episiotomy: None  Lacerations:  None  Details of delivery can be found in separate delivery note.  Patient had a routine postpartum course. Elevated blood pressure postpartum, started on procardia 3057mL and subscribed into [facility] is discharged home 08/26/21.  Newborn Data: Birth date:08/24/2021  Birth time:7:43 AM  Gender:Female  Living status:Living  Apgars:8 ,9  Weight:1980 g   Magnesium Sulfate received: No BMZ received: No Rhophylac: N/A MMR: N/A - Immune  T-DaP: Offered postpartum  Flu: No Transfusion: No   Physical exam  Vitals:   08/25/21 0851 08/25/21 1430 08/25/21 2137 08/26/21 0549  BP: 115/72 120/68 120/75 115/79  Pulse: 78 88 87 86  Resp:  _0 Temp:   98.7 F (37.1 C) 98.7 F (37.1 C) 98.7 F (37.1 C)  TempSrc:  Oral Oral Oral  SpO2:  100% 100% 100%  Weight:      Height:       General: alert, cooperative, and no distress Lochia: appropriate Uterine Fundus: firm Incision: N/A DVT Evaluation: 1+ to mid shin bilaterally and equal, non-tender  Labs: Lab Results  Component Value Date   WBC 17.2 (H) 08/25/2021   HGB 9.8 (L) 08/25/2021   HCT 30.4 (L) 08/25/2021   MCV 89.1 08/25/2021   PLT 293 08/25/2021   CMP Latest Ref Rng & Units 08/24/2021  Glucose 70 - 99 mg/dL 127(H)  BUN 6 - 20 mg/dL <5(L)  Creatinine 0.44 - 1.00 mg/dL 0.62  Sodium 135 - 145 mmol/L 134(L)  Potassium 3.5 - 5.1 mmol/L 3.3(L)  Chloride 98 - 111 mmol/L 107  CO2 22 - 32 mmol/L 19(L)  Calcium 8.9 - 10.3 mg/dL 8.7(L)  Total Protein 6.5 - 8.1 g/dL 5.8(L)  Total Bilirubin 0.3 - 1.2 mg/dL 0.6  Alkaline Phos 38 - 126 U/L 105  AST 15 - 41 U/L 25  ALT 0 - 44 U/L 27   Edinburgh Score: Edinburgh Postnatal Depression Scale Screening Tool 08/25/2021  I have been able to laugh and see the funny side of things. 0  I have looked forward with enjoyment to things. 0  I have blamed myself unnecessarily when things went wrong.  0  I have been anxious or worried for no good reason. 0  I have felt scared or panicky for no good reason. 0  Things have been getting on top of me. 1  I have been so unhappy that I have had difficulty sleeping. 0  I have felt sad or miserable. 0  I have been so unhappy that I have been crying. 0  The thought of harming myself has occurred to me. 0  Edinburgh Postnatal Depression Scale Total 1     After visit meds:  Allergies as of 08/26/2021   No Known Allergies      Medication List     TAKE these medications    acetaminophen 500 MG tablet Commonly known as: TYLENOL Take 2 tablets (1,000 mg total) by mouth every 8 (eight) hours as needed (pain).   Blood Pressure Kit Devi 1 kit by Does not apply route once a week.   ferrous  sulfate 325 (65 FE) MG tablet Take 1 tablet (325 mg total) by mouth every other day.   ibuprofen 600 MG tablet Commonly known as: ADVIL Take 1 tablet (600 mg total) by mouth every 6 (six) hours as needed.   NIFEdipine 30 MG 24 hr tablet Commonly known as: ADALAT CC Take 1 tablet (30 mg total) by mouth daily.   Vitafol Ultra 29-0.6-0.4-200 MG Caps Take 1 capsule by mouth daily before breakfast.         Discharge home in stable condition Infant Feeding: Bottle Infant Disposition: Likely rooming in  Discharge instruction: per After Visit Summary and Postpartum booklet. Activity: Advance as tolerated. Pelvic rest for 6 weeks.  Diet: routine diet Future Appointments: Future Appointments  Date Time Provider [place]  08/29/2021  8:15 AM [facility]  08/31/2021 11:00 AM [place] None  09/12/2021  8:15 AM [facility]  09/26/2021 10:15 AM Griffin Basil, MD [place] None   Follow up Visit: Message sent to [facility] by Dr. Gwenlyn Perking on 08/24/21. BP check message sent on 08/25/21.  Please schedule this patient for a In person postpartum visit in 4 weeks with the following provider: Any provider. Additional Postpartum F/U: BP check 1 week  Low risk pregnancy complicated by:  Elevated BP intra/postpartum Delivery mode:  Vaginal, Spontaneous  Anticipated Birth Control:  Unsure   08/26/2021 Patriciaann Clan, DO

## 2021-08-24 NOTE — Progress Notes (Signed)
Called to see patient for IUPC placement.    Pt had 2 prolonged decels, one about 3 minutes long - Pitocin was turned off during this time. Dr. Mathis Fare rechecked CE and unchanged - still about 5.5 cm. She attempted IUPC but was unable to place. She performed bedside US and confirmed cephalic and anterior placenta.  IUPC placed easily posterior on the patients right. Threaded easily and no bleeding noted. FSE applied as well.   CE 5.5/80/-2, but head moves down well with contractions.   Initial MVUs soon after pitocin was turned off appear clearly not adequate. We will leave pitocin off to allow some time for fetal recovery but then will resume pitocin.   Upon review of her birth history, she notes FT SVD at 37+ weeks of 5lb4oz baby after pushing about one hour in 2008.   At this time, I have no concern for CPD and anticipate SVD. FHT currently with baseline 150s, moderate variability, no apparent decelerations, overall currently category 1 off pitocin.

## 2021-08-25 LAB — CBC
HCT: 30.4 % — ABNORMAL LOW (ref 36.0–46.0)
Hemoglobin: 9.8 g/dL — ABNORMAL LOW (ref 12.0–15.0)
MCH: 28.7 pg (ref 26.0–34.0)
MCHC: 32.2 g/dL (ref 30.0–36.0)
MCV: 89.1 fL (ref 80.0–100.0)
Platelets: 293 10*3/uL (ref 150–400)
RBC: 3.41 MIL/uL — ABNORMAL LOW (ref 3.87–5.11)
RDW: 13.7 % (ref 11.5–15.5)
WBC: 17.2 10*3/uL — ABNORMAL HIGH (ref 4.0–10.5)
nRBC: 0 % (ref 0.0–0.2)

## 2021-08-25 LAB — CULTURE, BETA STREP (GROUP B ONLY)

## 2021-08-25 NOTE — Progress Notes (Signed)
Unable to obtain blood pressure cuff kit for baby scripts order. Notified Dr. Marburg, who stated that patient can be set up with kit in office during her blood pressure recheck. Order cancelled. Margalit Leece Hudspeth  

## 2021-08-25 NOTE — Progress Notes (Addendum)
POSTPARTUM PROGRESS NOTE  Post Partum Day 1  Subjective:  Molly Sims is a 35 y.o. [id number] s/p SVD at [redacted]w[redacted]d.  No acute events overnight.  Pt denies problems with ambulating, voiding or po intake.  She denies nausea or vomiting.  Pain is well controlled.  She has had flatus. She has had bowel movement.  Lochia Minimal.   Objective: Blood pressure (!) 136/93, pulse 87, temperature 98.5 F (36.9 C), temperature source Oral, resp. rate 16, height 5\' 2"  (1.575 m), weight 126.9 kg, last menstrual period 12/30/2020, SpO2 100 %, unknown if currently breastfeeding.  Physical Exam:  General: alert, cooperative and no distress Chest: no respiratory distress Heart: regular rate, distal pulses intact Abdomen: soft, nontender Uterine Fundus: firm, appropriately tender DVT Evaluation: No calf swelling or tenderness Extremities: No edema Skin: warm, dry  Recent Labs    08/22/21 1648 08/25/21 0508  HGB 9.9* 9.8*  HCT 30.6* 30.4*    Assessment/Plan: Molly Sims is a 35 y.o. 20 s/p SVD at [redacted]w[redacted]d   PPD#1 - Doing well, continue routine postpartum care.   Elevated BP intrapartum: - BP remained elevated postpartum. Mild range and asymptomatic.  - Started on Procardia 30 mg with improvement - Will continue to monitor and adjust dose as necessary   Contraception: Undecided Feeding: Bottle Dispo: Baby staying for weight and feeding observation, mom is comfortable staying with baby. Plan for discharge PPD#2.   LOS: 3 days   [redacted]w[redacted]d, 08/25/2021, 7:49 AM    Patient ID: 08/27/2021, female   DOB: June 13, 1986, 35 y.o.   MRN: [id number]   GME ATTESTATION:  I saw and evaluated the patient. I agree with the findings and the plan of care as documented in the student's note. I have made changes to documentation as necessary.  Progressing well postpartum and meeting all milestones. Will continue to monitor BP today and adjust Procardia dosing as necessary.  Babyscripts ordered. Will do medications to bedside and have one week BP check after discharge. Will send message to office today to coordinate this. Continue routine postpartum care.  [id number], MD OB Fellow, Faculty [facility], Center for [facility] 08/25/2021 9:15 AM

## 2021-08-25 NOTE — Progress Notes (Signed)
Obtained blood pressure cuff. Provided to patient and educated on how to use. Patient measured a blood pressure with her cuff and blood pressure transferred into episodes of care documentation. Earl Gala, Linda [place]

## 2021-08-26 ENCOUNTER — Other Ambulatory Visit (HOSPITAL_COMMUNITY): Payer: Self-pay

## 2021-08-26 LAB — SURGICAL PATHOLOGY

## 2021-08-26 MED ORDER — NIFEDIPINE ER 30 MG PO TB24
30.0000 mg | ORAL_TABLET | Freq: Every day | ORAL | 0 refills | Status: DC
  Filled 2021-08-26: qty 30, 30d supply, fill #0

## 2021-08-26 MED ORDER — ACETAMINOPHEN 500 MG PO TABS
1000.0000 mg | ORAL_TABLET | Freq: Three times a day (TID) | ORAL | 0 refills | Status: DC | PRN
  Filled 2021-08-26: qty 60, 10d supply, fill #0

## 2021-08-26 MED ORDER — IBUPROFEN 600 MG PO TABS
600.0000 mg | ORAL_TABLET | Freq: Four times a day (QID) | ORAL | 0 refills | Status: DC | PRN
  Filled 2021-08-26: qty 40, 10d supply, fill #0

## 2021-08-26 NOTE — Discharge Instructions (Signed)
Monitor your BP at home with your new cuff every day or every other day and keep in touch with baby scripts. Goal blood pressure would to be <130/80. If your BP >160/110, or you are having a severe headache not improved with tylenol/ibuprofen, worsening blurred or double vision, shortness of breath, or chest pain please come back to the hospital.

## 2021-08-29 ENCOUNTER — Other Ambulatory Visit: Payer: Medicaid Other

## 2021-08-31 ENCOUNTER — Ambulatory Visit: Payer: Medicaid Other

## 2021-09-06 ENCOUNTER — Telehealth (HOSPITAL_COMMUNITY): Payer: Self-pay | Admitting: *Deleted

## 2021-09-06 ENCOUNTER — Ambulatory Visit: Payer: Medicaid Other

## 2021-09-06 NOTE — Telephone Encounter (Signed)
Attempted hospital discharge follow-up call. No answer received. Deforest Hoyles, RN 09/06/21, [phone]

## 2021-09-12 ENCOUNTER — Other Ambulatory Visit: Payer: Medicaid Other

## 2021-09-26 ENCOUNTER — Ambulatory Visit: Payer: Medicaid Other | Admitting: Obstetrics and Gynecology

## 2021-11-13 NOTE — L&D Delivery Note (Signed)
OB/GYN Faculty Practice Delivery Note  Molly Sims is a 36 y.o. [id number] s/p SVD at [redacted]w[redacted]d. She was admitted for SOL.   ROM: AROM 430PM with clear fluid GBS Status: Unkown   Maximum Maternal Temperature: 98.7  Labor Progress: Initial SVE: 9.5. She then progressed to complete.   Delivery Date/Time: 9/23 @1650  Delivery: Called to room and patient was complete and pushing. Head delivered [place]. No nuchal cord present. Shoulder and body delivered in usual fashion. Infant with spontaneous cry, placed on mother's abdomen, dried and stimulated. Cord clamped x 2 after 1-minute delay, and cut by FOB. Cord blood drawn. Placenta delivered spontaneously with gentle cord traction. Fundus firm with massage and Pitocin. Labia, perineum, vagina, and cervix inspected with good hemostasis and no lacerations.  Baby Weight: pending  Placenta: 3 vessel, intact. Sent to L&D Complications: None Lacerations: None EBL: 254 mL Analgesia: None  Infant:  APGAR (1 MIN): 8   APGAR (5 MINS): 9    Kelsay Haggard Autry-Lott, DO OB Fellow, [place] for [employer] 08/05/2022, 8:46 PM

## 2022-01-23 ENCOUNTER — Ambulatory Visit (INDEPENDENT_AMBULATORY_CARE_PROVIDER_SITE_OTHER): Payer: 59 | Admitting: Emergency Medicine

## 2022-01-23 ENCOUNTER — Other Ambulatory Visit: Payer: Self-pay

## 2022-01-23 VITALS — BP 117/79 | HR 94 | Ht 62.0 in | Wt 267.6 lb

## 2022-01-23 DIAGNOSIS — Z3201 Encounter for pregnancy test, result positive: Secondary | ICD-10-CM

## 2022-01-23 LAB — POCT URINE PREGNANCY: Preg Test, Ur: POSITIVE — AB

## 2022-01-23 MED ORDER — VITAFOL ULTRA 29-0.6-0.4-200 MG PO CAPS: 1.0000 | ORAL_CAPSULE | Freq: Every day | ORAL | 11 refills | Status: DC

## 2022-01-23 NOTE — Progress Notes (Signed)
Molly Sims presents today for UPT. She has no unusual complaints and complains of nausea without vomiting for 7 days. ?LMP: 11/26/21 ?   ?OBJECTIVE: Appears well, in no apparent distress.  ?OB History   ? ? Gravida  ?3  ? Para  ?2  ? Term  ?1  ? Preterm  ?1  ? AB  ?   ? Living  ?2  ?  ? ? SAB  ?   ? IAB  ?   ? Ectopic  ?   ? Multiple  ?0  ? Live Births  ?2  ?   ?  ?  ? ?Home UPT Result: POSITIVE ?In-Office UPT result:POSITIVE ?I have reviewed the patient's medical, obstetrical, social, and family histories, and medications.  ? ?ASSESSMENT: Positive pregnancy test ? ?PLAN ?[facility] be [facility] at: [facility] ? ?Prenatal vitamins ordered, sent to pharmacy today.  ?  ?

## 2022-01-23 NOTE — Addendum Note (Signed)
Addended by: Sharlyne Pacas on: 01/23/2022 04:36 PM ? ? Modules accepted: Orders ? ?

## 2022-02-23 ENCOUNTER — Ambulatory Visit: Payer: Medicaid Other

## 2022-02-23 ENCOUNTER — Ambulatory Visit (INDEPENDENT_AMBULATORY_CARE_PROVIDER_SITE_OTHER): Payer: Medicaid Other

## 2022-02-23 VITALS — BP 120/66 | HR 86 | Ht 62.0 in | Wt 272.6 lb

## 2022-02-23 DIAGNOSIS — Z3481 Encounter for supervision of other normal pregnancy, first trimester: Secondary | ICD-10-CM

## 2022-02-23 DIAGNOSIS — Z3A12 12 weeks gestation of pregnancy: Secondary | ICD-10-CM | POA: Diagnosis not present

## 2022-02-23 DIAGNOSIS — Z3490 Encounter for supervision of normal pregnancy, unspecified, unspecified trimester: Secondary | ICD-10-CM | POA: Insufficient documentation

## 2022-02-23 NOTE — Progress Notes (Signed)
New OB Intake ? ?I connected with  Molly Sims on 02/23/22 at  8:15 AM EDT by in person and verified that I am speaking with the correct person using two identifiers. Nurse is located at [facility] and pt is located at [place]. ? ?I discussed the limitations, risks, security and privacy concerns of performing an evaluation and management service by telephone and the availability of in person appointments. I also discussed with the patient that there may be a patient responsible charge related to this service. The patient expressed understanding and agreed to proceed. ? ?I explained I am completing New OB Intake today. We discussed her EDD of 09/02/22 that is based on LMP of 11/26/21. Pt is G3/P1102. I reviewed her allergies, medications, Medical/Surgical/OB history, and appropriate screenings. I informed her of [facility] services. Based on history, this is a/an  pregnancy uncomplicated .  ? ?Patient Active Problem List  ? Diagnosis Date Noted  ? Vaginal delivery 08/24/2021  ? Obesity in pregnancy 08/24/2021  ? Preterm premature rupture of membranes (PPROM) with unknown onset of labor 08/22/2021  ? Retained intrauterine device (IUD) during pregnancy in third trimester 07/20/2021  ? Morbid obesity (HCC) 06/06/2021  ? Supervision of other normal pregnancy, antepartum 03/14/2021  ? ? ?Concerns addressed today ? ?Delivery Plans:  ?Plans to deliver at [facility].  ? ?MyChart/Babyscripts ?MyChart access verified. I explained pt will have some visits in office and some virtually. Babyscripts instructions given and order placed. Patient verifies receipt of registration text/e-mail. Account successfully created and app downloaded. ? ?Blood Pressure Cuff  ?Patient has cuff at home from last pregnancy. Explained after first prenatal appt pt will check weekly and document in Babyscripts. ? ?Weight scale: Patient does / does not  have weight scale. Weight scale ordered for patient to pick up from [employer].  ? ?Anatomy  US ?Explained first scheduled Korea will be around 19 weeks. Dating and viability scan performed today. ? ? ?Labs ?Discussed Avelina Laine genetic screening with patient. Would like both Panorama and Horizon drawn at new OB visit.Also if interested in genetic testing, tell patient she will need AFP 15-21 weeks to complete genetic testing .Routine prenatal labs needed. ? ?Covid Vaccine ?Patient has covid vaccine.  ? ?Is patient a CenteringPregnancy candidate? Not interested  "Centering Patient" indicated on sticky note ?  ?Is patient a Mom+Baby Combined Care candidate? Declined   Scheduled with Mom+Baby provider  ?  ?Is patient interested in [facility]? No  "Interested in [employer] - Schedule next visit with CNM" on sticky note ? ?Informed patient of [facility] website  and placed link in her AVS.  ? ?Social Determinants of Health ?Food Insecurity: Patient denies food insecurity. ?WIC Referral: Patient is interested in referral to [facility].  ?Transportation: Patient denies transportation needs. ?Childcare: Discussed no children allowed at ultrasound appointments. Offered childcare services; patient declines childcare services at this time. ? ?Send link to Pregnancy Navigators ? ? ?Placed OB Box on problem list and updated ? ?First visit review ?I reviewed new OB appt with pt. I explained she will have a pelvic exam, ob bloodwork with genetic screening, and PAP smear. Explained pt will be seen by Mariel Aloe at first visit; encounter routed to appropriate provider. Explained that patient will be seen by pregnancy navigator following visit with provider. [facility] information placed in AVS.  ? ?Hamilton Capri, RN ?02/23/2022  8:11 AM  ?

## 2022-03-14 ENCOUNTER — Encounter: Payer: Self-pay | Admitting: Obstetrics and Gynecology

## 2022-03-14 ENCOUNTER — Ambulatory Visit (INDEPENDENT_AMBULATORY_CARE_PROVIDER_SITE_OTHER): Payer: Medicaid Other | Admitting: Obstetrics and Gynecology

## 2022-03-14 ENCOUNTER — Other Ambulatory Visit (HOSPITAL_COMMUNITY)
Admission: RE | Admit: 2022-03-14 | Discharge: 2022-03-14 | Disposition: A | Discharge status: Home / Self Care | Payer: Medicaid Other | Source: Ambulatory Visit | Attending: Obstetrics and Gynecology | Admitting: Obstetrics and Gynecology

## 2022-03-14 VITALS — BP 124/90 | HR 92 | Wt 270.5 lb

## 2022-03-14 DIAGNOSIS — Z3482 Encounter for supervision of other normal pregnancy, second trimester: Secondary | ICD-10-CM

## 2022-03-14 DIAGNOSIS — Z3A15 15 weeks gestation of pregnancy: Secondary | ICD-10-CM | POA: Diagnosis not present

## 2022-03-14 DIAGNOSIS — Z3480 Encounter for supervision of other normal pregnancy, unspecified trimester: Secondary | ICD-10-CM

## 2022-03-14 DIAGNOSIS — O09892 Supervision of other high risk pregnancies, second trimester: Secondary | ICD-10-CM | POA: Diagnosis not present

## 2022-03-14 DIAGNOSIS — Z6841 Body Mass Index (BMI) 40.0 and over, adult: Secondary | ICD-10-CM | POA: Insufficient documentation

## 2022-03-14 DIAGNOSIS — O09899 Supervision of other high risk pregnancies, unspecified trimester: Secondary | ICD-10-CM | POA: Insufficient documentation

## 2022-03-14 MED ORDER — ASPIRIN 81 MG PO CHEW: 81.0000 mg | CHEWABLE_TABLET | Freq: Every day | ORAL | 7 refills | Status: DC

## 2022-03-14 NOTE — Progress Notes (Signed)
? ?INITIAL PRENATAL VISIT NOTE ? ?Subjective:  ?Molly Sims is a 36 y.o. [id number] at 59w3dby LMP c/w early u/s being seen today for her initial prenatal visit.  She has an obstetric history significant for short interval between pregnancies and hx of preterm delivery with last pregnancy. She has a medical history significant for obesity. ? ?Patient reports no complaints.  Contractions: Not present. Vag. Bleeding: None.  Movement: Absent. Denies leaking of fluid.  ? ? ?Past Medical History:  ?Diagnosis Date  ? Anemia   ? ? ?Past Surgical History:  ?Procedure Laterality Date  ? WISDOM TOOTH EXTRACTION    ? ? ?OB History  ?Gravida Para Term Preterm AB Living  ?_0 ?SAB IAB Ectopic Multiple Live Births  ?      0 2  ?  ?# Outcome Date GA Lbr Len/2nd Weight Sex Delivery Anes PTL Lv  ?3 Current           ?2 Preterm 08/24/21 349w1d 00:24 1.98 kg F Vag-Spont EPI  LIV  ?1 Term 05/13/07 3722w5dM Vag-Spont   LIV  ? ? ?Social History  ? ?Socioeconomic History  ? Marital status: Single  ?  Spouse name: Not on file  ? Number of children: Not on file  ? Years of education: Not on file  ? Highest education level: Not on file  ?Occupational History  ? Not on file  ?Tobacco Use  ? Smoking status: Never  ? Smokeless tobacco: Never  ?Vaping Use  ? Vaping Use: Never used  ?Substance and Sexual Activity  ? Alcohol use: Not Currently  ?  Comment: Not since confirmed pregnancy  ? Drug use: Yes  ?  Types: Marijuana  ?  Comment: none since pregnancy was confirmed  ? Sexual activity: Yes  ?  Partners: Male  ?  Birth control/protection: None  ?Other Topics Concern  ? Not on file  ?Social History Narrative  ? Not on file  ? ?Social Determinants of Health  ? ?Financial Resource Strain: Not on file  ?Food Insecurity: Not on file  ?Transportation Needs: Not on file  ?Physical Activity: Not on file  ?Stress: Not on file  ?Social Connections: Not on file  ? ? ?Family History  ?Problem Relation Age of Onset  ? Hypertension Mother    ? ? ? ?Current Outpatient Medications:  ?  aspirin 81 MG chewable tablet, Chew 1 tablet (81 mg total) by mouth daily., Disp: 30 tablet, Rfl: 7 ?  acetaminophen (TYLENOL) 500 MG tablet, Take 2 tablets (1,000 mg total) by mouth every 8 (eight) hours as needed (pain)., Disp: 60 tablet, Rfl: 0 ?  Blood Pressure Monitoring (BLOOD PRESSURE KIT) DEVI, 1 kit by Does not apply route once a week., Disp: 1 each, Rfl: 0 ?  Prenat-Fe Poly-Methfol-FA-DHA (VITAFOL ULTRA) 29-0.6-0.4-200 MG CAPS, Take 1 tablet by mouth daily., Disp: 30 capsule, Rfl: 11 ? ?No Known Allergies ? ?Review of Systems: Negative except for what is mentioned in HPI. ? ?Objective:  ? ?Vitals:  ? 03/14/22 1500  ?BP: 124/90  ?Pulse: 92  ?Weight: 122.7 kg  ? ? ?Fetal Status: Fetal Heart Rate (bpm): 145   Movement: Absent    ? ?Physical Exam: ?BP 124/90   Pulse 92   Wt 122.7 kg   LMP 11/26/2021 (Exact Date)   BMI 49.48 kg/m?  ?CONSTITUTIONAL: Well-developed, well-nourished obese female in no acute distress.  ?NEUROLOGIC: Alert and oriented to person, place,  and time. Normal reflexes, muscle tone coordination. No cranial nerve deficit noted. ?PSYCHIATRIC: Normal mood and affect. Normal behavior. Normal judgment and thought content. ?SKIN: Skin is warm and dry. No rash noted. Not diaphoretic. No erythema. No pallor. ?HENT:  Normocephalic, atraumatic, External right and left ear normal. Oropharynx is clear and moist ?EYES: Conjunctivae and EOM are normal.  ?NECK: Normal range of motion, supple, no masses ?CARDIOVASCULAR: Normal heart rate noted, regular rhythm ?RESPIRATORY: Effort and breath sounds normal, no problems with respiration noted ?BREASTS: symmetric, non-tender, no masses palpable, chaperone present ?ABDOMEN: Soft, nontender, nondistended, gravid. ?GU: normal appearing external female genitalia, vaginal swab taken ?Bimanual: 14 weeks sized uterus, no adnexal tenderness or palpable lesions noted, chaperone present ?MUSCULOSKELETAL: Normal range of  motion. ?EXT:  No edema and no tenderness. 2+ distal pulses. ? ? ?Assessment and Plan:  ?Pregnancy: [id number] at 87w3dby LMP ? ?1. Encounter for supervision of other normal pregnancy in second trimester ?Will start baby ASA now ?CMP and P/c ratio added to baseline labs ?Will schedule early 2 hour GTT for surveillance ? ?- Culture, OB Urine ?- Genetic Screening ?- Hepatitis C Antibody ?- Hepatitis B Surface AntiGEN ?- RPR ?- HIV antibody (with reflex) ?- Cervicovaginal ancillary only( [place]) ?- Comprehensive metabolic panel ?- Protein / creatinine ratio, urine ?- aspirin 81 MG chewable tablet; Chew 1 tablet (81 mg total) by mouth daily.  Dispense: 30 tablet; Refill: 7 ? ?2. [redacted] weeks gestation of pregnancy ? ? ?3. Supervision of other normal pregnancy, antepartum ? ? ?4. BMI 45.0-49.9, [place] ? ? ?5. History of premature delivery, currently pregnant, second trimester ?Monitor for signs of PTL, consider cervical length ? ?6. Short interval between pregnancies affecting pregnancy in second trimester, antepartum ? ? ? ?Preterm labor symptoms and general obstetric precautions including but not limited to vaginal bleeding, contractions, leaking of fluid and fetal movement were reviewed in detail with the patient. ? ?Please refer to After Visit Summary for other counseling recommendations.  ? ?Return in about 4 weeks (around 04/11/2022) [facility] ? ?LGriffin Basil?03/14/2022 ?3:33 PM  ?

## 2022-03-15 LAB — COMPREHENSIVE METABOLIC PANEL
ALT: 21 IU/L (ref 0–32)
AST: 13 IU/L (ref 0–40)
Albumin/Globulin Ratio: 1.3 (ref 1.2–2.2)
Albumin: 3.8 g/dL (ref 3.8–4.8)
Alkaline Phosphatase: 33 IU/L — ABNORMAL LOW (ref 44–121)
BUN/Creatinine Ratio: 14 (ref 9–23)
BUN: 8 mg/dL (ref 6–20)
Bilirubin Total: 0.2 mg/dL (ref 0.0–1.2)
CO2: 19 mmol/L — ABNORMAL LOW (ref 20–29)
Calcium: 9.2 mg/dL (ref 8.7–10.2)
Chloride: 105 mmol/L (ref 96–106)
Creatinine, Ser: 0.56 mg/dL — ABNORMAL LOW (ref 0.57–1.00)
Globulin, Total: 3 g/dL (ref 1.5–4.5)
Glucose: 70 mg/dL (ref 70–99)
Potassium: 3.9 mmol/L (ref 3.5–5.2)
Sodium: 138 mmol/L (ref 134–144)
Total Protein: 6.8 g/dL (ref 6.0–8.5)
eGFR: 123 mL/min/{1.73_m2} (ref >59)

## 2022-03-15 LAB — CERVICOVAGINAL ANCILLARY ONLY
Chlamydia: NEGATIVE
Comment: NEGATIVE
Comment: NEGATIVE
Comment: NORMAL
Neisseria Gonorrhea: NEGATIVE
Trichomonas: NEGATIVE

## 2022-03-15 LAB — HEPATITIS C ANTIBODY: Hep C Virus Ab: NONREACTIVE

## 2022-03-15 LAB — HIV ANTIBODY (ROUTINE TESTING W REFLEX): HIV Screen 4th Generation wRfx: NONREACTIVE

## 2022-03-15 LAB — HEPATITIS B SURFACE ANTIGEN: Hepatitis B Surface Ag: NEGATIVE

## 2022-03-15 LAB — RPR: RPR Ser Ql: NONREACTIVE

## 2022-03-16 LAB — PROTEIN / CREATININE RATIO, URINE
Creatinine, Urine: 151.8 mg/dL
Protein, Ur: 13 mg/dL
Protein/Creat Ratio: 86 mg/g creat (ref 0–200)

## 2022-03-19 LAB — URINE CULTURE, OB REFLEX

## 2022-03-19 LAB — CULTURE, OB URINE

## 2022-03-20 ENCOUNTER — Encounter: Payer: Self-pay | Admitting: Obstetrics and Gynecology

## 2022-03-28 ENCOUNTER — Other Ambulatory Visit: Payer: Medicaid Other

## 2022-03-28 DIAGNOSIS — Z3480 Encounter for supervision of other normal pregnancy, unspecified trimester: Secondary | ICD-10-CM

## 2022-03-30 LAB — AFP, SERUM, OPEN SPINA BIFIDA
AFP MoM: 2.05
AFP Value: 63.1 ng/mL
Gest. Age on Collection Date: 17 wk
Maternal Age At EDD: 35.1 a
OSBR Risk 1 IN: 1443
Test Results:: NEGATIVE
Weight: 270 [lb_av]

## 2022-03-30 LAB — GLUCOSE TOLERANCE, 2 HOURS W/ 1HR
Glucose, 1 hour: 77 mg/dL (ref 70–179)
Glucose, 2 hour: 75 mg/dL (ref 70–152)
Glucose, Fasting: 68 mg/dL — ABNORMAL LOW (ref 70–91)

## 2022-04-11 ENCOUNTER — Encounter: Payer: Medicaid Other | Admitting: Student

## 2022-04-11 ENCOUNTER — Encounter: Payer: Medicaid Other | Admitting: Family Medicine

## 2022-04-24 ENCOUNTER — Ambulatory Visit (INDEPENDENT_AMBULATORY_CARE_PROVIDER_SITE_OTHER): Payer: Medicaid Other | Admitting: Obstetrics and Gynecology

## 2022-04-24 ENCOUNTER — Encounter: Payer: Self-pay | Admitting: Obstetrics and Gynecology

## 2022-04-24 VITALS — BP 116/76 | HR 93 | Wt 274.0 lb

## 2022-04-24 DIAGNOSIS — O09892 Supervision of other high risk pregnancies, second trimester: Secondary | ICD-10-CM

## 2022-04-24 DIAGNOSIS — Z3A21 21 weeks gestation of pregnancy: Secondary | ICD-10-CM | POA: Diagnosis not present

## 2022-04-24 DIAGNOSIS — O99210 Obesity complicating pregnancy, unspecified trimester: Secondary | ICD-10-CM

## 2022-04-24 DIAGNOSIS — O99212 Obesity complicating pregnancy, second trimester: Secondary | ICD-10-CM | POA: Diagnosis not present

## 2022-04-24 DIAGNOSIS — Z3482 Encounter for supervision of other normal pregnancy, second trimester: Secondary | ICD-10-CM

## 2022-04-24 NOTE — Progress Notes (Signed)
   PRENATAL VISIT NOTE  Subjective:  Molly Sims is a 36 y.o. [id number] at [redacted]w[redacted]d being seen today for ongoing prenatal care.  She is currently monitored for the following issues for this high-risk pregnancy and has Supervision of other normal pregnancy, antepartum; Morbid obesity ([place]); Retained intrauterine device (IUD) during pregnancy in third trimester; Preterm premature rupture of membranes (PPROM) with unknown onset of labor; Vaginal delivery; Obesity in pregnancy; Supervision of normal pregnancy; BMI 45.0-49.9, adult ([place]); History of premature delivery, currently pregnant, second trimester; and Short interval between pregnancies affecting pregnancy in second trimester, antepartum on their problem list.  Patient reports no complaints.  Contractions: Not present. Vag. Bleeding: None.  Movement: Absent. Denies leaking of fluid.   The following portions of the patient's history were reviewed and updated as appropriate: allergies, current medications, past family history, past medical history, past social history, past surgical history and problem list.   Objective:   Vitals:   04/24/22 1613  BP: 116/76  Pulse: 93  Weight: 274 lb (124.3 kg)    Fetal Status: Fetal Heart Rate (bpm): 147 Fundal Height: 21 cm Movement: Absent     General:  Alert, oriented and cooperative. Patient is in no acute distress.  Skin: Skin is warm and dry. No rash noted.   Cardiovascular: Normal heart rate noted  Respiratory: Normal respiratory effort, no problems with respiration noted  Abdomen: Soft, gravid, appropriate for gestational age.  Pain/Pressure: Absent     Pelvic: Cervical exam deferred        Extremities: Normal range of motion.  Edema: Trace  Mental Status: Normal mood and affect. Normal behavior. Normal judgment and thought content.   Assessment and Plan:  Pregnancy: G3P1102 at [redacted]w[redacted]d 1. Encounter for supervision of other normal pregnancy in second trimester Patient is doing well without  complaints Anatomy ultrasound ordered  2. Obesity in pregnancy Continue ASA  3. History of premature delivery, currently pregnant, second trimester   4. Short interval between pregnancies affecting pregnancy in second trimester, antepartum   Preterm labor symptoms and general obstetric precautions including but not limited to vaginal bleeding, contractions, leaking of fluid and fetal movement were reviewed in detail with the patient. Please refer to After Visit Summary for other counseling recommendations.   Return in about 4 weeks (around 05/22/2022) for in person, ROB, High risk.  No future appointments.  Mora Bellman, MD

## 2022-05-22 ENCOUNTER — Encounter: Payer: Medicaid Other | Admitting: Obstetrics & Gynecology

## 2022-05-23 ENCOUNTER — Ambulatory Visit: Payer: Medicaid Other | Attending: Obstetrics and Gynecology

## 2022-05-23 ENCOUNTER — Encounter: Payer: Self-pay | Admitting: *Deleted

## 2022-05-23 ENCOUNTER — Ambulatory Visit: Payer: Medicaid Other | Admitting: *Deleted

## 2022-05-23 ENCOUNTER — Other Ambulatory Visit: Payer: Self-pay | Admitting: *Deleted

## 2022-05-23 ENCOUNTER — Encounter: Payer: Self-pay | Admitting: Obstetrics and Gynecology

## 2022-05-23 VITALS — BP 127/70 | HR 92

## 2022-05-23 DIAGNOSIS — Z3A25 25 weeks gestation of pregnancy: Secondary | ICD-10-CM | POA: Diagnosis not present

## 2022-05-23 DIAGNOSIS — O409XX0 Polyhydramnios, unspecified trimester, not applicable or unspecified: Secondary | ICD-10-CM | POA: Insufficient documentation

## 2022-05-23 DIAGNOSIS — O99210 Obesity complicating pregnancy, unspecified trimester: Secondary | ICD-10-CM | POA: Diagnosis not present

## 2022-05-23 DIAGNOSIS — D563 Thalassemia minor: Secondary | ICD-10-CM | POA: Insufficient documentation

## 2022-05-23 DIAGNOSIS — Z6841 Body Mass Index (BMI) 40.0 and over, adult: Secondary | ICD-10-CM

## 2022-05-23 DIAGNOSIS — O99212 Obesity complicating pregnancy, second trimester: Secondary | ICD-10-CM | POA: Insufficient documentation

## 2022-05-23 DIAGNOSIS — O09892 Supervision of other high risk pregnancies, second trimester: Secondary | ICD-10-CM | POA: Insufficient documentation

## 2022-05-23 DIAGNOSIS — Z3482 Encounter for supervision of other normal pregnancy, second trimester: Secondary | ICD-10-CM | POA: Diagnosis not present

## 2022-05-23 DIAGNOSIS — O402XX0 Polyhydramnios, second trimester, not applicable or unspecified: Secondary | ICD-10-CM | POA: Insufficient documentation

## 2022-05-23 DIAGNOSIS — O09212 Supervision of pregnancy with history of pre-term labor, second trimester: Secondary | ICD-10-CM | POA: Diagnosis not present

## 2022-05-23 DIAGNOSIS — Z363 Encounter for antenatal screening for malformations: Secondary | ICD-10-CM | POA: Insufficient documentation

## 2022-05-31 ENCOUNTER — Encounter: Payer: Self-pay | Admitting: Medical

## 2022-05-31 ENCOUNTER — Ambulatory Visit (INDEPENDENT_AMBULATORY_CARE_PROVIDER_SITE_OTHER): Payer: Medicaid Other | Admitting: Medical

## 2022-05-31 VITALS — BP 133/79 | HR 91 | Wt 278.8 lb

## 2022-05-31 DIAGNOSIS — O2633 Retained intrauterine contraceptive device in pregnancy, third trimester: Secondary | ICD-10-CM

## 2022-05-31 DIAGNOSIS — O409XX0 Polyhydramnios, unspecified trimester, not applicable or unspecified: Secondary | ICD-10-CM | POA: Diagnosis not present

## 2022-05-31 DIAGNOSIS — O99210 Obesity complicating pregnancy, unspecified trimester: Secondary | ICD-10-CM

## 2022-05-31 DIAGNOSIS — Z3482 Encounter for supervision of other normal pregnancy, second trimester: Secondary | ICD-10-CM | POA: Diagnosis not present

## 2022-05-31 DIAGNOSIS — K640 First degree hemorrhoids: Secondary | ICD-10-CM

## 2022-05-31 DIAGNOSIS — O2632 Retained intrauterine contraceptive device in pregnancy, second trimester: Secondary | ICD-10-CM

## 2022-05-31 DIAGNOSIS — O09892 Supervision of other high risk pregnancies, second trimester: Secondary | ICD-10-CM

## 2022-05-31 DIAGNOSIS — Z3A26 26 weeks gestation of pregnancy: Secondary | ICD-10-CM

## 2022-05-31 DIAGNOSIS — K5901 Slow transit constipation: Secondary | ICD-10-CM

## 2022-05-31 MED ORDER — DOCUSATE SODIUM 100 MG PO CAPS: 100.0000 mg | ORAL_CAPSULE | Freq: Every day | ORAL | 0 refills | Status: DC | PRN

## 2022-05-31 NOTE — Progress Notes (Signed)
Patient presents for ROB. Denies having any headaches or visual changes. No other concerns. Request rx for a stool softener.

## 2022-05-31 NOTE — Patient Instructions (Signed)
Remember that at your next visit you will get labs drawn. You need to be fasting (nothing to eat or drink for at least 8 hours prior to arrival) for those labs. We will be testing for gestational diabetes, anemia, HIV and syphilis. You will be in the office for at least 2 hours to complete the lab tests and your visit will occur during this time. You will also be offered the TDAP vaccine for whooping cough.  

## 2022-06-01 NOTE — Progress Notes (Signed)
   PRENATAL VISIT NOTE  Subjective:  Molly Sims is a 36 y.o. [id number] at [redacted]w[redacted]d being seen today for ongoing prenatal care.  She is currently monitored for the following issues for this high-risk pregnancy and has Morbid obesity (HCC); Retained intrauterine device (IUD) during pregnancy in third trimester; Obesity in pregnancy; Supervision of normal pregnancy; BMI 45.0-49.9, adult (HCC); History of premature delivery, currently pregnant, second trimester; Short interval between pregnancies affecting pregnancy in second trimester, antepartum; and Polyhydramnios affecting pregnancy on their problem list.  Patient reports no complaints.  Contractions: Not present. Vag. Bleeding: None.  Movement: Present. Denies leaking of fluid.   The following portions of the patient's history were reviewed and updated as appropriate: allergies, current medications, past family history, past medical history, past social history, past surgical history and problem list.   Objective:   Vitals:   05/31/22 1133 05/31/22 1205  BP: (!) 127/91 133/79  Pulse: (!) 103 91  Weight: 278 lb 12.8 oz (126.5 kg)     Fetal Status: Fetal Heart Rate (bpm): 150   Movement: Present     General:  Alert, oriented and cooperative. Patient is in no acute distress.  Skin: Skin is warm and dry. No rash noted.   Cardiovascular: Normal heart rate noted  Respiratory: Normal respiratory effort, no problems with respiration noted  Abdomen: Soft, gravid, appropriate for gestational age.  Pain/Pressure: Absent     Pelvic: Cervical exam deferred        Extremities: Normal range of motion.  Edema: Trace  Mental Status: Normal mood and affect. Normal behavior. Normal judgment and thought content.   Assessment and Plan:  Pregnancy: G3P1102 at [redacted]w[redacted]d 1. Encounter for supervision of other normal pregnancy in second trimester  2. Short interval between pregnancies affecting pregnancy in second trimester, antepartum - last child delivered  08/2021  3. Polyhydramnios affecting pregnancy - Mild - Q 4 week Korea with MFM - No antenatal testing recommended at this time for mild polyhydramnios - Plan for delivery 39-40 weeks, discussed with patient  - Next [place] with MFM 8/8  4. History of premature delivery, currently pregnant, second trimester  5. Obesity in pregnancy - BASA   6. Retained intrauterine device (IUD) during pregnancy in third trimester - Removed at 30 weeks   7. [redacted] weeks gestation of pregnancy  8. Slow transit constipation - docusate sodium (COLACE) 100 MG capsule; Take 1 capsule (100 mg total) by mouth daily as needed for mild constipation.  Dispense: 30 capsule; Refill: 0  9. Grade I hemorrhoids - docusate sodium (COLACE) 100 MG capsule; Take 1 capsule (100 mg total) by mouth daily as needed for mild constipation.  Dispense: 30 capsule; Refill: 0  Preterm labor symptoms and general obstetric precautions including but not limited to vaginal bleeding, contractions, leaking of fluid and fetal movement were reviewed in detail with the patient. Please refer to After Visit Summary for other counseling recommendations.   Return in about 2 weeks (around 06/14/2022) for [facility] APP, 28 week labs (fasting), In-Person.  Future Appointments  Date Time Provider Department Center  06/20/2022  7:30 AM [facility] NURSE [facility]  06/20/2022  7:45 AM [facility] US4 [facility]  06/27/2022  8:35 AM Leftwich-Kirby, Wilmer Floor, CNM [facility] None  07/18/2022  7:45 AM [facility] NURSE [facility]  07/18/2022  8:00 AM [facility] US1 [facility]    Vonzella Nipple, PA-C

## 2022-06-20 ENCOUNTER — Encounter: Payer: Self-pay | Admitting: *Deleted

## 2022-06-20 ENCOUNTER — Ambulatory Visit: Payer: Medicaid Other | Admitting: *Deleted

## 2022-06-20 ENCOUNTER — Other Ambulatory Visit: Payer: Self-pay | Admitting: *Deleted

## 2022-06-20 ENCOUNTER — Ambulatory Visit: Payer: Medicaid Other | Attending: Maternal & Fetal Medicine

## 2022-06-20 VITALS — BP 134/84 | HR 97

## 2022-06-20 DIAGNOSIS — O409XX0 Polyhydramnios, unspecified trimester, not applicable or unspecified: Secondary | ICD-10-CM | POA: Insufficient documentation

## 2022-06-20 DIAGNOSIS — O09293 Supervision of pregnancy with other poor reproductive or obstetric history, third trimester: Secondary | ICD-10-CM | POA: Insufficient documentation

## 2022-06-20 DIAGNOSIS — O99213 Obesity complicating pregnancy, third trimester: Secondary | ICD-10-CM | POA: Insufficient documentation

## 2022-06-20 DIAGNOSIS — D563 Thalassemia minor: Secondary | ICD-10-CM | POA: Diagnosis not present

## 2022-06-20 DIAGNOSIS — Z6841 Body Mass Index (BMI) 40.0 and over, adult: Secondary | ICD-10-CM | POA: Insufficient documentation

## 2022-06-20 DIAGNOSIS — O285 Abnormal chromosomal and genetic finding on antenatal screening of mother: Secondary | ICD-10-CM | POA: Diagnosis not present

## 2022-06-20 DIAGNOSIS — O403XX0 Polyhydramnios, third trimester, not applicable or unspecified: Secondary | ICD-10-CM | POA: Diagnosis not present

## 2022-06-20 DIAGNOSIS — O09893 Supervision of other high risk pregnancies, third trimester: Secondary | ICD-10-CM

## 2022-06-20 DIAGNOSIS — E669 Obesity, unspecified: Secondary | ICD-10-CM

## 2022-06-20 DIAGNOSIS — O09213 Supervision of pregnancy with history of pre-term labor, third trimester: Secondary | ICD-10-CM | POA: Diagnosis not present

## 2022-06-20 DIAGNOSIS — Z3A29 29 weeks gestation of pregnancy: Secondary | ICD-10-CM | POA: Diagnosis not present

## 2022-06-27 ENCOUNTER — Encounter: Payer: Medicaid Other | Admitting: Advanced Practice Midwife

## 2022-06-27 NOTE — Progress Notes (Deleted)
   PRENATAL VISIT NOTE  Subjective:  Molly Sims is a 36 y.o. O1Y0737 at [redacted]w[redacted]d being seen today for ongoing prenatal care.  She is currently monitored for the following issues for this {Blank single:19197::"high-risk","low-risk"} pregnancy and has Morbid obesity (HCC); Retained intrauterine device (IUD) during pregnancy in third trimester; Obesity in pregnancy; Supervision of normal pregnancy; BMI 45.0-49.9, adult (HCC); History of premature delivery, currently pregnant, second trimester; Short interval between pregnancies affecting pregnancy in second trimester, antepartum; and Polyhydramnios affecting pregnancy on their problem list.  Patient reports {[id number]}.   .  .   . Denies leaking of fluid.   The following portions of the patient's history were reviewed and updated as appropriate: allergies, current medications, past family history, past medical history, past social history, past surgical history and problem list.   Objective:  There were no vitals filed for this visit.  Fetal Status:           General:  Alert, oriented and cooperative. Patient is in no acute distress.  Skin: Skin is warm and dry. No rash noted.   Cardiovascular: Normal heart rate noted  Respiratory: Normal respiratory effort, no problems with respiration noted  Abdomen: Soft, gravid, appropriate for gestational age.        Pelvic: {Blank single:19197::"Cervical exam performed in the presence of a chaperone","Cervical exam deferred"}        Extremities: Normal range of motion.     Mental Status: Normal mood and affect. Normal behavior. Normal judgment and thought content.   Assessment and Plan:  Pregnancy: G3P1102 at [redacted]w[redacted]d 1. Supervision of high risk pregnancy in third trimester ***  2. History of premature delivery, currently pregnant, second trimester ***  3. Polyhydramnios affecting pregnancy ***  {Blank single:19197::"Term","Preterm"} labor symptoms and general obstetric precautions including but  not limited to vaginal bleeding, contractions, leaking of fluid and fetal movement were reviewed in detail with the patient. Please refer to After Visit Summary for other counseling recommendations.   No follow-ups on file.  Future Appointments  Date Time Provider Department Center  07/18/2022  7:45 AM [facility] NURSE [facility]  07/18/2022  8:00 AM [facility] US1 [facility]  07/25/2022  7:30 AM [facility] NURSE [facility]  07/25/2022  7:45 AM [facility] US4 [facility]  08/01/2022  7:30 AM [facility] NURSE [facility]  08/01/2022  7:45 AM [facility] US4 [facility]  08/08/2022  7:30 AM [facility] NURSE [facility]  08/08/2022  7:45 AM [facility] US4 [facility]    Sharen Counter, CNM

## 2022-07-07 ENCOUNTER — Encounter: Payer: Medicaid Other | Admitting: Certified Nurse Midwife

## 2022-07-07 DIAGNOSIS — Z3A31 31 weeks gestation of pregnancy: Secondary | ICD-10-CM

## 2022-07-07 DIAGNOSIS — O0993 Supervision of high risk pregnancy, unspecified, third trimester: Secondary | ICD-10-CM

## 2022-07-07 DIAGNOSIS — O409XX0 Polyhydramnios, unspecified trimester, not applicable or unspecified: Secondary | ICD-10-CM

## 2022-07-07 DIAGNOSIS — O99210 Obesity complicating pregnancy, unspecified trimester: Secondary | ICD-10-CM

## 2022-07-07 DIAGNOSIS — O09893 Supervision of other high risk pregnancies, third trimester: Secondary | ICD-10-CM

## 2022-07-18 ENCOUNTER — Ambulatory Visit: Payer: Medicaid Other

## 2022-07-18 ENCOUNTER — Ambulatory Visit: Payer: Medicaid Other | Attending: Maternal & Fetal Medicine

## 2022-07-19 ENCOUNTER — Encounter: Payer: Medicaid Other | Admitting: Obstetrics & Gynecology

## 2022-07-25 ENCOUNTER — Ambulatory Visit: Payer: Medicaid Other | Attending: Maternal & Fetal Medicine

## 2022-07-25 ENCOUNTER — Ambulatory Visit: Payer: Medicaid Other

## 2022-08-01 ENCOUNTER — Ambulatory Visit: Payer: Medicaid Other

## 2022-08-01 ENCOUNTER — Ambulatory Visit: Payer: Medicaid Other | Attending: Maternal & Fetal Medicine

## 2022-08-01 ENCOUNTER — Telehealth: Payer: Self-pay

## 2022-08-01 NOTE — Telephone Encounter (Signed)
Spoke with pt about her FMLA paperwork and told the pt that there is a $15 charge for this paperwork to be picked up, faxed, mailed and etc. She stated that she understands and will have her payment

## 2022-08-02 ENCOUNTER — Other Ambulatory Visit: Payer: Self-pay

## 2022-08-02 ENCOUNTER — Inpatient Hospital Stay (HOSPITAL_BASED_OUTPATIENT_CLINIC_OR_DEPARTMENT_OTHER): Payer: Medicaid Other

## 2022-08-02 ENCOUNTER — Encounter (HOSPITAL_COMMUNITY): Payer: Self-pay | Admitting: Family Medicine

## 2022-08-02 ENCOUNTER — Inpatient Hospital Stay (HOSPITAL_COMMUNITY)
Admission: AD | Admit: 2022-08-02 | Discharge: 2022-08-03 | Disposition: A | Discharge status: Home / Self Care | Payer: Medicaid Other | Attending: Family Medicine | Admitting: Family Medicine

## 2022-08-02 DIAGNOSIS — Z3A35 35 weeks gestation of pregnancy: Secondary | ICD-10-CM

## 2022-08-02 DIAGNOSIS — R102 Pelvic and perineal pain: Secondary | ICD-10-CM | POA: Diagnosis not present

## 2022-08-02 DIAGNOSIS — R109 Unspecified abdominal pain: Secondary | ICD-10-CM | POA: Insufficient documentation

## 2022-08-02 DIAGNOSIS — O09213 Supervision of pregnancy with history of pre-term labor, third trimester: Secondary | ICD-10-CM | POA: Diagnosis not present

## 2022-08-02 DIAGNOSIS — O26893 Other specified pregnancy related conditions, third trimester: Secondary | ICD-10-CM | POA: Insufficient documentation

## 2022-08-02 DIAGNOSIS — O403XX0 Polyhydramnios, third trimester, not applicable or unspecified: Secondary | ICD-10-CM

## 2022-08-02 DIAGNOSIS — Z3493 Encounter for supervision of normal pregnancy, unspecified, third trimester: Secondary | ICD-10-CM

## 2022-08-02 DIAGNOSIS — O09523 Supervision of elderly multigravida, third trimester: Secondary | ICD-10-CM | POA: Insufficient documentation

## 2022-08-02 DIAGNOSIS — D563 Thalassemia minor: Secondary | ICD-10-CM

## 2022-08-02 DIAGNOSIS — O368130 Decreased fetal movements, third trimester, not applicable or unspecified: Secondary | ICD-10-CM | POA: Diagnosis not present

## 2022-08-02 DIAGNOSIS — O99213 Obesity complicating pregnancy, third trimester: Secondary | ICD-10-CM

## 2022-08-02 DIAGNOSIS — E669 Obesity, unspecified: Secondary | ICD-10-CM

## 2022-08-02 DIAGNOSIS — O285 Abnormal chromosomal and genetic finding on antenatal screening of mother: Secondary | ICD-10-CM

## 2022-08-02 LAB — URINALYSIS, ROUTINE W REFLEX MICROSCOPIC
Bacteria, UA: NONE SEEN
Bilirubin Urine: NEGATIVE
Glucose, UA: NEGATIVE mg/dL
Ketones, ur: NEGATIVE mg/dL
Nitrite: NEGATIVE
Protein, ur: 30 mg/dL — AB
Specific Gravity, Urine: 1.028 (ref 1.005–1.030)
pH: 5 (ref 5.0–8.0)

## 2022-08-02 LAB — WET PREP, GENITAL
Sperm: NONE SEEN
Trich, Wet Prep: NONE SEEN
WBC, Wet Prep HPF POC: >=10 — AB (ref <10)
Yeast Wet Prep HPF POC: NONE SEEN

## 2022-08-02 NOTE — MAU Note (Signed)
.  Molly Sims is a 36 y.o. at [redacted]w[redacted]d here in MAU reporting: lower abdominal pressure that comes and goes since yesterday. States it feels like a contraction, but not as Reports DFM.Marland Kitchen states has not felt movement since this morning. Denies VB or LOF.Marland Kitchen states she does have light pink spotting when she wipes.   Onset of complaint: yesterday at 12 noon.  Pain score: 7 Vitals:   08/02/22 2158  BP: 118/66  Pulse: 95  Resp: 17  Temp: 98.5 F (36.9 C)  SpO2: 98%     FHT:148 Lab orders placed from triage:  UA

## 2022-08-02 NOTE — MAU Provider Note (Signed)
History     CSN: [id number]  Arrival date and time: 08/02/22 2145   Event Date/Time   First Provider Initiated Contact with Patient 08/02/22 2224      Chief Complaint  Patient presents with   Decreased Fetal Movement   Abdominal Pain   Molly Sims, a  36 y.o. [id number] at [redacted]w[redacted]d presents to MAU with complaints of decreased fetal movement since this morning. Patient states she was working throughout the day and hadnt noticed movement. Patient states she drank water at home and still did not feel movement. Since arrival to MAU patient states she has felt baby 1 time. She also endorses intermittent vaginal pressure since yesterday. States its not pain, just occasional pressure. She denies vaginal bleeding but notes "light" spotting with wiping. Denies abnormal vaginal discharge and states last intercourse was 1 month ago. Denies nausea, vomiting, diarrhea and constipation. Also denies urinary symptoms.    OB History     Gravida  3   Para  2   Term  1   Preterm  1   AB      Living  2      SAB      IAB      Ectopic      Multiple  0   Live Births  2           Past Medical History:  Diagnosis Date   Anemia     Past Surgical History:  Procedure Laterality Date   WISDOM TOOTH EXTRACTION      Family History  Problem Relation Age of Onset   Hypertension Mother     Social History   Tobacco Use   Smoking status: Never   Smokeless tobacco: Never  Vaping Use   Vaping Use: Never used  Substance Use Topics   Alcohol use: Not Currently    Comment: Not since confirmed pregnancy   Drug use: Not Currently    Types: Marijuana    Comment: none since pregnancy was confirmed    Allergies: No Known Allergies  Medications Prior to Admission  Medication Sig Dispense Refill Last Dose   aspirin 81 MG chewable tablet Chew 1 tablet (81 mg total) by mouth daily. 30 tablet 7 08/02/2022   Prenat-Fe Poly-Methfol-FA-DHA (VITAFOL ULTRA) 29-0.6-0.4-200 MG CAPS Take 1  tablet by mouth daily. 30 capsule 11 08/02/2022   acetaminophen (TYLENOL) 500 MG tablet Take 2 tablets (1,000 mg total) by mouth every 8 (eight) hours as needed (pain). (Patient not taking: Reported on 05/31/2022) 60 tablet 0    Blood Pressure Monitoring (BLOOD PRESSURE KIT) DEVI 1 kit by Does not apply route once a week. 1 each 0    docusate sodium (COLACE) 100 MG capsule Take 1 capsule (100 mg total) by mouth daily as needed for mild constipation. 30 capsule 0     Review of Systems  Constitutional:  Negative for chills, fatigue and fever.  Eyes:  Negative for pain and visual disturbance.  Respiratory:  Negative for apnea, shortness of breath and wheezing.   Cardiovascular:  Negative for chest pain and palpitations.  Gastrointestinal:  Negative for abdominal pain, constipation, diarrhea, nausea and vomiting.  Genitourinary:  Positive for pelvic pain and vaginal bleeding. Negative for difficulty urinating, dysuria, flank pain, vaginal discharge and vaginal pain.  Musculoskeletal:  Negative for back pain.  Neurological:  Negative for seizures, weakness and headaches.  Psychiatric/Behavioral:  Negative for suicidal ideas.    Physical Exam   Blood pressure 113/62, pulse 98, temperature  98.5 F (36.9 C), temperature source Oral, resp. rate 17, height $RemoveBe'5\' 2"'RnkSStWDu$  (1.575 m), weight 126.6 kg, last menstrual period 11/26/2021, SpO2 98 %, unknown if currently breastfeeding.  Physical Exam Vitals and nursing note reviewed. Exam conducted with a chaperone present.  Constitutional:      General: She is not in acute distress.    Appearance: Normal appearance. She is obese.  HENT:     Head: Normocephalic.  Cardiovascular:     Rate and Rhythm: Normal rate and regular rhythm.  Pulmonary:     Effort: Pulmonary effort is normal.     Breath sounds: Normal breath sounds.  Abdominal:     Palpations: Abdomen is soft.     Tenderness: There is abdominal tenderness.  Genitourinary:    Vagina: Normal.      Cervix: Dilated. Discharge present.     Comments: Small amount of pink tinged and light brown discharge noted at the base of the cervix.  Musculoskeletal:     Cervical back: Normal range of motion.  Skin:    General: Skin is warm and dry.     Capillary Refill: Capillary refill takes less than 2 seconds.  Neurological:     Mental Status: She is alert and oriented to person, place, and time.  Psychiatric:        Mood and Affect: Mood normal.    Dilation: 2 Effacement (%): Thick Exam by:: Huntley Dec, CNM  FHT: 150 bpm with min-moderate variability. Variable decels with quick return to baseline.  Toco: irregular 2-6. Patient unaware of contractions.   MAU Course  Procedures Orders Placed This Encounter  Procedures   Wet prep, genital   [place] MFM FETAL BPP WO NON STRESS   Urinalysis, Routine w reflex microscopic Urine, Clean Catch   Results for orders placed or performed during the hospital encounter of 08/02/22 (from the past 24 hour(s))  Urinalysis, Routine w reflex microscopic Urine, Clean Catch     Status: Abnormal   Collection Time: 08/02/22 10:11 PM  Result Value Ref Range   Color, Urine AMBER (A) YELLOW   APPearance CLOUDY (A) CLEAR   Specific Gravity, Urine 1.028 1.005 - 1.030   pH 5.0 5.0 - 8.0   Glucose, UA NEGATIVE NEGATIVE mg/dL   Hgb urine dipstick LARGE (A) NEGATIVE   Bilirubin Urine NEGATIVE NEGATIVE   Ketones, ur NEGATIVE NEGATIVE mg/dL   Protein, ur 30 (A) NEGATIVE mg/dL   Nitrite NEGATIVE NEGATIVE   Leukocytes,Ua LARGE (A) NEGATIVE   RBC / HPF 0-5 0 - 5 RBC/hpf   WBC, UA 11-20 0 - 5 WBC/hpf   Bacteria, UA NONE SEEN NONE SEEN   Squamous Epithelial / LPF 11-20 0 - 5   Mucus PRESENT   Wet prep, genital     Status: Abnormal   Collection Time: 08/02/22 10:53 PM  Result Value Ref Range   Yeast Wet Prep HPF POC NONE SEEN NONE SEEN   Trich, Wet Prep NONE SEEN NONE SEEN   Clue Cells Wet Prep HPF POC PRESENT (A) NONE SEEN   WBC, Wet Prep HPF POC >=10 (A) <10    Sperm NONE SEEN     Reassessment of FHT: 135bpm with moderate variability. 15x15 accels and no decels present. Toco: 6-7 mins, patient unaware of contractions.     MDM Lab results reviewed and interpreted by me   - UA reflexed to culture  - Wet prep positive for clue cells and WBCs.  - Reassessment @ 0003 Cervical exam, 2/thick/ballottable. Huntley Dec  CNM Contractions every 6-7 mins. Cervical exam unchanged.  - Low suspicion for preterm labor.   - BPP 8/8 with reactive NST 10/10. Patient feeling fetal movement in MAU and patient reassured by these findings.   - Plan for discharge   Assessment and Plan   1. Movement of fetus present during pregnancy in third trimester   2. Pelvic pressure in pregnancy, antepartum, third trimester   3. [redacted] weeks gestation of pregnancy    - Discussed that pelvic pressure can be a normal discomfort of pregnancy.  -  Worsening signs and return precautions reviewed at bedside.  - Fetal Kick counts reviewed.  - FHT Cat I at time of discharge  - Preterm labor precautions reviewed.  - Patient discharged home in stable condition and may return MAU as needed.   Jacquiline Doe, MSN, CNM  08/02/2022, 11:34 PM

## 2022-08-02 NOTE — MAU Provider Note (Incomplete)
History     CSN: [id number]  Arrival date and time: 08/02/22 2145   Event Date/Time   First Provider Initiated Contact with Patient 08/02/22 2224      Chief Complaint  Patient presents with  . Decreased Fetal Movement  . Abdominal Pain   Molly Sims, a  36 y.o. [id number] at [redacted]w[redacted]d presents to MAU with complaints of decreased fetal movement since this morning. Patient states she was working throughout the day and hadnt noticed movement. Patient states she drank water at home and still did not feel movement. Since arrival to MAU patient states she has felt baby 1 time. She also endorses intermittent vaginal pressure since yesterday. States its not pain, just occasional pressure. She denies vaginal bleeding but notes "light" spotting with wiping. Denies abnormal vaginal discharge and states last intercourse was 1 month ago. Denies nausea, vomiting, diarrhea and constipation. Also denies urinary symptoms.     OB History     Gravida  3   Para  2   Term  1   Preterm  1   AB      Living  2      SAB      IAB      Ectopic      Multiple  0   Live Births  2           Past Medical History:  Diagnosis Date  . Anemia     Past Surgical History:  Procedure Laterality Date  . WISDOM TOOTH EXTRACTION      Family History  Problem Relation Age of Onset  . Hypertension Mother     Social History   Tobacco Use  . Smoking status: Never  . Smokeless tobacco: Never  Vaping Use  . Vaping Use: Never used  Substance Use Topics  . Alcohol use: Not Currently    Comment: Not since confirmed pregnancy  . Drug use: Not Currently    Types: Marijuana    Comment: none since pregnancy was confirmed    Allergies: No Known Allergies  Medications Prior to Admission  Medication Sig Dispense Refill Last Dose  . aspirin 81 MG chewable tablet Chew 1 tablet (81 mg total) by mouth daily. 30 tablet 7 08/02/2022  . Prenat-Fe Poly-Methfol-FA-DHA (VITAFOL ULTRA) 29-0.6-0.4-200 MG  CAPS Take 1 tablet by mouth daily. 30 capsule 11 08/02/2022  . acetaminophen (TYLENOL) 500 MG tablet Take 2 tablets (1,000 mg total) by mouth every 8 (eight) hours as needed (pain). (Patient not taking: Reported on 05/31/2022) 60 tablet 0   . Blood Pressure Monitoring (BLOOD PRESSURE KIT) DEVI 1 kit by Does not apply route once a week. 1 each 0   . docusate sodium (COLACE) 100 MG capsule Take 1 capsule (100 mg total) by mouth daily as needed for mild constipation. 30 capsule 0     Review of Systems  Constitutional:  Negative for chills, fatigue and fever.  Eyes:  Negative for pain and visual disturbance.  Respiratory:  Negative for apnea, shortness of breath and wheezing.   Cardiovascular:  Negative for chest pain and palpitations.  Gastrointestinal:  Negative for abdominal pain, constipation, diarrhea, nausea and vomiting.  Genitourinary:  Positive for pelvic pain and vaginal bleeding. Negative for difficulty urinating, dysuria, flank pain, vaginal discharge and vaginal pain.  Musculoskeletal:  Negative for back pain.  Neurological:  Negative for seizures, weakness and headaches.  Psychiatric/Behavioral:  Negative for suicidal ideas.    Physical Exam   Blood pressure 113/62, pulse 98,  temperature 98.5 F (36.9 C), temperature source Oral, resp. rate 17, height $RemoveBe'5\' 2"'[id number]$  (1.575 m), weight 126.6 kg, last menstrual period 11/26/2021, SpO2 98 %, unknown if currently breastfeeding.  Physical Exam Vitals and nursing note reviewed. Exam conducted with a chaperone present.  Constitutional:      General: She is not in acute distress.    Appearance: Normal appearance. She is obese.  HENT:     Head: Normocephalic.  Cardiovascular:     Rate and Rhythm: Normal rate and regular rhythm.  Pulmonary:     Effort: Pulmonary effort is normal.     Breath sounds: Normal breath sounds.  Abdominal:     Palpations: Abdomen is soft.     Tenderness: There is abdominal tenderness.  Genitourinary:    Vagina:  Normal.     Cervix: Dilated. Discharge present.  Musculoskeletal:     Cervical back: Normal range of motion.  Skin:    General: Skin is warm and dry.     Capillary Refill: Capillary refill takes less than 2 seconds.  Neurological:     Mental Status: She is alert and oriented to person, place, and time.  Psychiatric:        Mood and Affect: Mood normal.    Dilation: 2 Effacement (%): Thick Exam by:: Huntley Dec, CNM  FHT: 150 bpm with min-moderate variability. Variable decels with quick return to baseline.  Toco: irregular 2-6. Patient unaware of contractions.   MAU Course  Procedures Orders Placed This Encounter  Procedures  . Wet prep, genital  . [place] MFM FETAL BPP WO NON STRESS  . Urinalysis, Routine w reflex microscopic Urine, Clean Catch   Results for orders placed or performed during the hospital encounter of 08/02/22 (from the past 24 hour(s))  Urinalysis, Routine w reflex microscopic Urine, Clean Catch     Status: Abnormal   Collection Time: 08/02/22 10:11 PM  Result Value Ref Range   Color, Urine AMBER (A) YELLOW   APPearance CLOUDY (A) CLEAR   Specific Gravity, Urine 1.028 1.005 - 1.030   pH 5.0 5.0 - 8.0   Glucose, UA NEGATIVE NEGATIVE mg/dL   Hgb urine dipstick LARGE (A) NEGATIVE   Bilirubin Urine NEGATIVE NEGATIVE   Ketones, ur NEGATIVE NEGATIVE mg/dL   Protein, ur 30 (A) NEGATIVE mg/dL   Nitrite NEGATIVE NEGATIVE   Leukocytes,Ua LARGE (A) NEGATIVE   RBC / HPF 0-5 0 - 5 RBC/hpf   WBC, UA 11-20 0 - 5 WBC/hpf   Bacteria, UA NONE SEEN NONE SEEN   Squamous Epithelial / LPF 11-20 0 - 5   Mucus PRESENT   Wet prep, genital     Status: Abnormal   Collection Time: 08/02/22 10:53 PM  Result Value Ref Range   Yeast Wet Prep HPF POC NONE SEEN NONE SEEN   Trich, Wet Prep NONE SEEN NONE SEEN   Clue Cells Wet Prep HPF POC PRESENT (A) NONE SEEN   WBC, Wet Prep HPF POC >=10 (A) <10   Sperm NONE SEEN     Reassessment of FHT: 135bpm with moderate variability. 15x15  accels and no decels present. Toco: 6-7 mins, patient unaware of contractions.     MDM Lab results reviewed and interpreted by me   - UA reflexed to culture  - Wet prep positive for clue cells and WBCs.  - Reassessment @ 0003 Cervical exam, 2/thick/ballottable. Huntley Dec CNM Contractions every 6-7 mins. Cervical exam unchanged.  - Low suspicion for preterm labor.   - BPP 8/8  with reactive NST 10/10. Patient feeling fetal movement in MAU and patient reassured by these findings.   - Plan for discharge  Assessment and Plan    Molly Sims 08/02/2022, 11:34 PM

## 2022-08-03 DIAGNOSIS — Z3A35 35 weeks gestation of pregnancy: Secondary | ICD-10-CM

## 2022-08-03 DIAGNOSIS — R102 Pelvic and perineal pain: Secondary | ICD-10-CM

## 2022-08-03 DIAGNOSIS — O26893 Other specified pregnancy related conditions, third trimester: Secondary | ICD-10-CM

## 2022-08-03 LAB — GC/CHLAMYDIA PROBE AMP (~~LOC~~) NOT AT ARMC
Chlamydia: NEGATIVE
Comment: NEGATIVE
Comment: NORMAL
Neisseria Gonorrhea: NEGATIVE

## 2022-08-03 MED ORDER — METRONIDAZOLE 500 MG PO TABS: 500.0000 mg | ORAL_TABLET | Freq: Two times a day (BID) | ORAL | 0 refills | Status: DC

## 2022-08-05 ENCOUNTER — Other Ambulatory Visit: Payer: Self-pay

## 2022-08-05 ENCOUNTER — Encounter (HOSPITAL_COMMUNITY): Payer: Self-pay | Admitting: Obstetrics & Gynecology

## 2022-08-05 ENCOUNTER — Inpatient Hospital Stay (HOSPITAL_COMMUNITY)
Admission: AD | Admit: 2022-08-05 | Discharge: 2022-08-07 | DRG: 807 | Disposition: A | Discharge status: Home / Self Care | Payer: Medicaid Other | Attending: Obstetrics & Gynecology | Admitting: Obstetrics & Gynecology

## 2022-08-05 DIAGNOSIS — O403XX1 Polyhydramnios, third trimester, fetus 1: Secondary | ICD-10-CM | POA: Diagnosis not present

## 2022-08-05 DIAGNOSIS — O26893 Other specified pregnancy related conditions, third trimester: Secondary | ICD-10-CM | POA: Diagnosis present

## 2022-08-05 DIAGNOSIS — O99214 Obesity complicating childbirth: Secondary | ICD-10-CM | POA: Diagnosis present

## 2022-08-05 DIAGNOSIS — Z7982 Long term (current) use of aspirin: Secondary | ICD-10-CM | POA: Diagnosis not present

## 2022-08-05 DIAGNOSIS — Z3A36 36 weeks gestation of pregnancy: Secondary | ICD-10-CM

## 2022-08-05 DIAGNOSIS — O403XX0 Polyhydramnios, third trimester, not applicable or unspecified: Secondary | ICD-10-CM | POA: Diagnosis present

## 2022-08-05 LAB — CBC
HCT: 35 % — ABNORMAL LOW (ref 36.0–46.0)
Hemoglobin: 11.6 g/dL — ABNORMAL LOW (ref 12.0–15.0)
MCH: 29 pg (ref 26.0–34.0)
MCHC: 33.1 g/dL (ref 30.0–36.0)
MCV: 87.5 fL (ref 80.0–100.0)
Platelets: 347 10*3/uL (ref 150–400)
RBC: 4 MIL/uL (ref 3.87–5.11)
RDW: 14.3 % (ref 11.5–15.5)
WBC: 14.6 10*3/uL — ABNORMAL HIGH (ref 4.0–10.5)
nRBC: 0 % (ref 0.0–0.2)

## 2022-08-05 LAB — TYPE AND SCREEN
ABO/RH(D): O POS
Antibody Screen: NEGATIVE

## 2022-08-05 MED ORDER — ACETAMINOPHEN 325 MG PO TABS: 650.0000 mg | ORAL_TABLET | ORAL | Status: DC | PRN

## 2022-08-05 MED ORDER — LACTATED RINGERS IV SOLN
500.0000 mL | Freq: Once | INTRAVENOUS | Status: AC
  Administered 2022-08-05: 500 mL via INTRAVENOUS

## 2022-08-05 MED ORDER — IBUPROFEN 600 MG PO TABS
600.0000 mg | ORAL_TABLET | Freq: Four times a day (QID) | ORAL | Status: DC
  Administered 2022-08-05 – 2022-08-07 (×9): 600 mg via ORAL
  Filled 2022-08-05 (×9): qty 1

## 2022-08-05 MED ORDER — ZOLPIDEM TARTRATE 5 MG PO TABS: 5.0000 mg | ORAL_TABLET | Freq: Every evening | ORAL | Status: DC | PRN

## 2022-08-05 MED ORDER — BENZOCAINE-MENTHOL 20-0.5 % EX AERO: 1.0000 | INHALATION_SPRAY | CUTANEOUS | Status: DC | PRN

## 2022-08-05 MED ORDER — ONDANSETRON HCL 4 MG/2ML IJ SOLN: 4.0000 mg | Freq: Four times a day (QID) | INTRAMUSCULAR | Status: DC | PRN

## 2022-08-05 MED ORDER — ONDANSETRON HCL 4 MG PO TABS: 4.0000 mg | ORAL_TABLET | ORAL | Status: DC | PRN

## 2022-08-05 MED ORDER — FENTANYL-BUPIVACAINE-NACL 0.5-0.125-0.9 MG/250ML-% EP SOLN: 12.0000 mL/h | EPIDURAL | Status: DC | PRN

## 2022-08-05 MED ORDER — EPHEDRINE 5 MG/ML INJ: 10.0000 mg | INTRAVENOUS | Status: DC | PRN

## 2022-08-05 MED ORDER — TETANUS-DIPHTH-ACELL PERTUSSIS 5-2.5-18.5 LF-MCG/0.5 IM SUSY: 0.5000 mL | PREFILLED_SYRINGE | Freq: Once | INTRAMUSCULAR | Status: DC

## 2022-08-05 MED ORDER — DIBUCAINE (PERIANAL) 1 % EX OINT: 1.0000 | TOPICAL_OINTMENT | CUTANEOUS | Status: DC | PRN

## 2022-08-05 MED ORDER — SIMETHICONE 80 MG PO CHEW: 80.0000 mg | CHEWABLE_TABLET | ORAL | Status: DC | PRN

## 2022-08-05 MED ORDER — WITCH HAZEL-GLYCERIN EX PADS: 1.0000 | MEDICATED_PAD | CUTANEOUS | Status: DC | PRN

## 2022-08-05 MED ORDER — DIPHENHYDRAMINE HCL 50 MG/ML IJ SOLN: 12.5000 mg | INTRAMUSCULAR | Status: DC | PRN

## 2022-08-05 MED ORDER — PHENYLEPHRINE 80 MCG/ML (10ML) SYRINGE FOR IV PUSH (FOR BLOOD PRESSURE SUPPORT): 80.0000 ug | PREFILLED_SYRINGE | INTRAVENOUS | Status: DC | PRN

## 2022-08-05 MED ORDER — COCONUT OIL OIL: 1.0000 | TOPICAL_OIL | Status: DC | PRN

## 2022-08-05 MED ORDER — LACTATED RINGERS IV SOLN: INTRAVENOUS | Status: DC

## 2022-08-05 MED ORDER — PRENATAL MULTIVITAMIN CH
1.0000 | ORAL_TABLET | Freq: Every day | ORAL | Status: DC
  Administered 2022-08-06 – 2022-08-07 (×2): 1 via ORAL
  Filled 2022-08-05 (×2): qty 1

## 2022-08-05 MED ORDER — OXYTOCIN-SODIUM CHLORIDE 30-0.9 UT/500ML-% IV SOLN
2.5000 [IU]/h | INTRAVENOUS | Status: DC
  Filled 2022-08-05: qty 500

## 2022-08-05 MED ORDER — LACTATED RINGERS IV SOLN: 500.0000 mL | INTRAVENOUS | Status: DC | PRN

## 2022-08-05 MED ORDER — SOD CITRATE-CITRIC ACID 500-334 MG/5ML PO SOLN: 30.0000 mL | ORAL | Status: DC | PRN

## 2022-08-05 MED ORDER — FENTANYL CITRATE (PF) 100 MCG/2ML IJ SOLN: 50.0000 ug | INTRAMUSCULAR | Status: DC | PRN

## 2022-08-05 MED ORDER — ONDANSETRON HCL 4 MG/2ML IJ SOLN: 4.0000 mg | INTRAMUSCULAR | Status: DC | PRN

## 2022-08-05 MED ORDER — ACETAMINOPHEN 325 MG PO TABS
650.0000 mg | ORAL_TABLET | ORAL | Status: DC | PRN
  Administered 2022-08-05: 650 mg via ORAL
  Filled 2022-08-05: qty 2

## 2022-08-05 MED ORDER — OXYTOCIN BOLUS FROM INFUSION: 333.0000 mL | Freq: Once | INTRAVENOUS | Status: DC

## 2022-08-05 MED ORDER — DIPHENHYDRAMINE HCL 25 MG PO CAPS: 25.0000 mg | ORAL_CAPSULE | Freq: Four times a day (QID) | ORAL | Status: DC | PRN

## 2022-08-05 MED ORDER — LIDOCAINE HCL (PF) 1 % IJ SOLN: 30.0000 mL | INTRAMUSCULAR | Status: DC | PRN

## 2022-08-05 MED ORDER — SENNOSIDES-DOCUSATE SODIUM 8.6-50 MG PO TABS
2.0000 | ORAL_TABLET | ORAL | Status: DC
  Administered 2022-08-06 – 2022-08-07 (×2): 2 via ORAL
  Filled 2022-08-05 (×2): qty 2

## 2022-08-05 NOTE — MAU Note (Signed)
Molly Sims is a 36 y.o. at [redacted]w[redacted]d here in MAU reporting: started contracting around noon.  No bleeding or leaking. Was 2 cm when last checked. Baby is moving Onset of complaint: 1200 Pain score: 8 There were no vitals filed for this visit.  Unable due to pt's activity FHT:too uncomfortable, taken to rm Lab orders placed from triage:

## 2022-08-05 NOTE — Discharge Summary (Signed)
Postpartum Discharge Summary  Date of Service updated***     Patient Name: Molly Sims DOB: 16-May-1986 MRN: [id number]  Date of admission: 08/05/2022 Delivery date:08/05/2022  Delivering provider: Gerlene Fee  Date of discharge: 08/05/2022  Admitting diagnosis: Preterm labor [O60.00] Intrauterine pregnancy: [redacted]w[redacted]d     Secondary diagnosis:  Principal Problem:   Vaginal delivery Active Problems:   Preterm labor   Preterm delivery  Additional problems: ***    Discharge diagnosis: Term Pregnancy Delivered                                              Post partum procedures:{Postpartum procedures:23558} Augmentation: AROM Complications: None  Hospital course: Onset of Labor With Vaginal Delivery      36 y.o. yo [id number] at [redacted]w[redacted]d was admitted in Active Labor on 08/05/2022. Patient had an uncomplicated labor course as follows:  Membrane Rupture Time/Date: 4:30 AM ,08/05/2022   Delivery Method:Vaginal, Spontaneous  Episiotomy: None  Lacerations:  None  Patient had an uncomplicated postpartum course.  She is ambulating, tolerating a regular diet, passing flatus, and urinating well. Patient is discharged home in stable condition on 08/05/22.  Newborn Data: Birth date:08/05/2022  Birth time:4:50 PM  Gender:Female  Living status:Living  Apgars:8 ,9  Weight:2230 g   Magnesium Sulfate received: No BMZ received: No Rhophylac:No MMR:No T-DaP:{Tdap:23962} Flu: {JYN:82956} Transfusion:{Transfusion received:30440034}  Physical exam  Vitals:   08/05/22 1717 08/05/22 1720 08/05/22 1730 08/05/22 1755  BP:  (!) 154/60 117/66 107/67  Pulse:  88 91 95  Resp:  19 20   Temp: 98.7 F (37.1 C)     TempSrc: Oral     SpO2:      Weight:      Height:       General: {Exam; general:21111117} Lochia: {Desc; appropriate/inappropriate:30686::"appropriate"} Uterine Fundus: {Desc; firm/soft:30687} Incision: {Exam; incision:21111123} DVT Evaluation: {Exam; dvt:2111122} Labs: Lab  Results  Component Value Date   WBC 14.6 (H) 08/05/2022   HGB 11.6 (L) 08/05/2022   HCT 35.0 (L) 08/05/2022   MCV 87.5 08/05/2022   PLT 347 08/05/2022      Latest Ref Rng & Units 03/14/2022    3:40 PM  CMP  Glucose 70 - 99 mg/dL 70   BUN 6 - 20 mg/dL 8   Creatinine 0.57 - 1.00 mg/dL 0.56   Sodium 134 - 144 mmol/L 138   Potassium 3.5 - 5.2 mmol/L 3.9   Chloride 96 - 106 mmol/L 105   CO2 20 - 29 mmol/L 19   Calcium 8.7 - 10.2 mg/dL 9.2   Total Protein 6.0 - 8.5 g/dL 6.8   Total Bilirubin 0.0 - 1.2 mg/dL 0.2   Alkaline Phos 44 - 121 IU/L 33   AST 0 - 40 IU/L 13   ALT 0 - 32 IU/L 21    Edinburgh Score:    08/25/2021   11:49 AM  Edinburgh Postnatal Depression Scale Screening Tool  I have been able to laugh and see the funny side of things. 0  I have looked forward with enjoyment to things. 0  I have blamed myself unnecessarily when things went wrong. 0  I have been anxious or worried for no good reason. 0  I have felt scared or panicky for no good reason. 0  Things have been getting on top of me. 1  I have been so unhappy that  I have had difficulty sleeping. 0  I have felt sad or miserable. 0  I have been so unhappy that I have been crying. 0  The thought of harming myself has occurred to me. 0  Edinburgh Postnatal Depression Scale Total 1     After visit meds:  Allergies as of 08/05/2022   No Known Allergies   Med Rec must be completed prior to using this [facility]***        Discharge home in stable condition Infant Feeding: {Baby feeding:23562} Infant Disposition:{CHL IP OB HOME WITH GCYOYO:41753} Discharge instruction: per After Visit Summary and Postpartum booklet. Activity: Advance as tolerated. Pelvic rest for 6 weeks.  Diet: routine diet Future Appointments: Future Appointments  Date Time Provider [place]  08/08/2022  7:30 AM [facility] NURSE [facility]  08/08/2022  7:45 AM [facility] US4 [facility]  08/15/2022  8:55 AM Cresenzo, Angelyn Punt, MD  [facility] None   Follow up Visit:  Message sent to [facility] by Autry-Lott on 08/05/2022  Please schedule this patient for a In person postpartum visit in 4 weeks with the following provider: MD and APP. Additional Postpartum F/U: none   High risk pregnancy complicated by:  obesity and polyhydramnios Delivery mode:  Vaginal, Spontaneous  Anticipated Birth Control:  IUD   08/05/2022 Naaman Plummer Autry-Lott, DO

## 2022-08-05 NOTE — H&P (Cosign Needed Addendum)
OBSTETRIC ADMISSION HISTORY AND PHYSICAL  Molly Sims is a 36 y.o. female [phone] with IUP at [redacted]w[redacted]d by LMP presenting for SOL. She reports +FMs, No LOF, no VB.  She plans on formula feeding. She request IUD for birth control. She received her prenatal care at [facility]: By LMP --->  Estimated Date of Delivery: 09/02/22  Sono:    '@[redacted]w[redacted]d'$ , CWD, normal anatomy, cephalic presentation,  314H, 44% EFW   Prenatal History/Complications:  -Obesity -Hisotry of premature birth -Short interval pregnancy -polyhydramnios  Past Medical History: Past Medical History:  Diagnosis Date   Anemia     Past Surgical History: Past Surgical History:  Procedure Laterality Date   WISDOM TOOTH EXTRACTION      Obstetrical History: OB History     Gravida  3   Para  3   Term  1   Preterm  2   AB      Living  3      SAB      IAB      Ectopic      Multiple  0   Live Births  3           Social History Social History   Socioeconomic History   Marital status: Single    Spouse name: Not on file   Number of children: Not on file   Years of education: Not on file   Highest education level: Not on file  Occupational History   Not on file  Tobacco Use   Smoking status: Never   Smokeless tobacco: Never  Vaping Use   Vaping Use: Never used  Substance and Sexual Activity   Alcohol use: Not Currently    Comment: Not since confirmed pregnancy   Drug use: Not Currently    Types: Marijuana    Comment: none since pregnancy was confirmed   Sexual activity: Yes    Partners: Male    Birth control/protection: I.U.D.  Other Topics Concern   Not on file  Social History Narrative   Not on file   Social Determinants of Health   Financial Resource Strain: Not on file  Food Insecurity: No Food Insecurity (08/05/2022)   Hunger Vital Sign    Worried About Running Out of Food in the Last Year: Never true    Ran Out of Food in the Last Year: Never true  Transportation  Needs: No Transportation Needs (08/05/2022)   PRAPARE - Hydrologist (Medical): No    Lack of Transportation (Non-Medical): No  Physical Activity: Not on file  Stress: Not on file  Social Connections: Not on file    Family History: Family History  Problem Relation Age of Onset   Hypertension Mother     Allergies: No Known Allergies  Medications Prior to Admission  Medication Sig Dispense Refill Last Dose   aspirin 81 MG chewable tablet Chew 1 tablet (81 mg total) by mouth daily. 30 tablet 7 Past Week   Blood Pressure Monitoring (BLOOD PRESSURE KIT) DEVI 1 kit by Does not apply route once a week. 1 each 0 08/05/2022   Prenat-Fe Poly-Methfol-FA-DHA (VITAFOL ULTRA) 29-0.6-0.4-200 MG CAPS Take 1 tablet by mouth daily. 30 capsule 11 08/05/2022   acetaminophen (TYLENOL) 500 MG tablet Take 2 tablets (1,000 mg total) by mouth every 8 (eight) hours as needed (pain). (Patient not taking: Reported on 05/31/2022) 60 tablet 0    docusate sodium (COLACE) 100 MG capsule Take 1 capsule (100 mg  total) by mouth daily as needed for mild constipation. (Patient not taking: Reported on 08/05/2022) 30 capsule 0 Not Taking   metroNIDAZOLE (FLAGYL) 500 MG tablet Take 1 tablet (500 mg total) by mouth 2 (two) times daily. (Patient not taking: Reported on 08/05/2022) 14 tablet 0 Not Taking     Review of Systems   All systems reviewed and negative except as stated in HPI  Blood pressure (!) 112/90, pulse 83, temperature 98.5 F (36.9 C), temperature source Oral, resp. rate 18, height $RemoveBe'5\' 2"'KAUIqqYUO$  (1.575 m), weight 127.2 kg, last menstrual period 11/26/2021, SpO2 100 %, unknown if currently breastfeeding. General appearance: alert and moderate distress Lungs: clear to auscultation bilaterally Heart: regular rate Abdomen: gravid Pelvic: below.  Extremities: Homans sign is negative, no sign of DVT Presentation: cephalic Fetal monitoringBaseline: 150 bpm, Variability: Good {> 6 bpm),  Accelerations: Reactive, and Decelerations: Absent Uterine activity 1-2 mins Dilation: 10 Effacement (%): 100 Station: Crowning Exam by:: Dr. Janus Molder   Prenatal labs: ABO, Rh: --/--/O POS (09/23 1635) Antibody: NEG (09/23 1635) Rubella:   RPR: Non Reactive (05/02 1540)  HBsAg: Negative (05/02 1540)  HIV: Non Reactive (05/02 1540)  GBS:    1 hr Glucola 77 Genetic screening  LR, female Anatomy US wnl, poly  Prenatal Transfer Tool  Maternal Diabetes: No Genetic Screening: Normal Maternal Ultrasounds/Referrals: Other:Polyhydramnios Fetal Ultrasounds or other Referrals:  Referred to Materal Fetal Medicine  Maternal Substance Abuse:  No Significant Maternal Medications:  None Significant Maternal Lab Results:  Other: GBS UNKOWN; precipitous delivery Number of Prenatal Visits:Less than or equal to 3 verified prenatal visits Other Comments:  None  Results for orders placed or performed during the hospital encounter of 08/05/22 (from the past 24 hour(s))  CBC   Collection Time: 08/05/22  4:30 PM  Result Value Ref Range   WBC 14.6 (H) 4.0 - 10.5 K/uL   RBC 4.00 3.87 - 5.11 MIL/uL   Hemoglobin 11.6 (L) 12.0 - 15.0 g/dL   HCT 35.0 (L) 36.0 - 46.0 %   MCV 87.5 80.0 - 100.0 fL   MCH 29.0 26.0 - 34.0 pg   MCHC 33.1 30.0 - 36.0 g/dL   RDW 14.3 11.5 - 15.5 %   Platelets 347 150 - 400 K/uL   nRBC 0.0 0.0 - 0.2 %  Type and screen [place]   Collection Time: 08/05/22  4:35 PM  Result Value Ref Range   ABO/RH(D) O POS    Antibody Screen NEG    Sample Expiration      08/08/2022,2359 Performed at [facility], [place] [address], [place],  [address]     Patient Active Problem List   Diagnosis Date Noted   Preterm labor 08/05/2022   Preterm delivery 08/05/2022   Polyhydramnios affecting pregnancy 05/23/2022   BMI 45.0-49.9, adult ([place]) 03/14/2022   History of premature delivery, currently pregnant, second trimester 03/14/2022   Short interval  between pregnancies affecting pregnancy in second trimester, antepartum 03/14/2022   Supervision of normal pregnancy 02/23/2022   Vaginal delivery 08/24/2021   Obesity in pregnancy 08/24/2021    Assessment/Plan:  Molly Sims is a 36 y.o. [id number] at [redacted]w[redacted]d here for SOL.   #Labor: Expectant managment #Pain: Maternally supported #FWB: Cat I #ID:  GBS unknown, precipitous delivery #MOF: Bottle #MOC: IUD #Circ:  ?  Mauricia Mertens Autry-Lott, DO  08/05/2022, 8:47 PM

## 2022-08-05 NOTE — Progress Notes (Signed)
Pt informed that the ultrasound is considered a limited OB ultrasound and is not intended to be a complete ultrasound exam.  Patient also informed that the ultrasound is not being completed with the intent of assessing for fetal or placental anomalies or any pelvic abnormalities.  Explained that the purpose of today's ultrasound is to assess for  presentation.  Patient acknowledges the purpose of the exam and the limitations of the study.    Cephalic  Molly Friis, NP   

## 2022-08-06 LAB — CBC
HCT: 27.6 % — ABNORMAL LOW (ref 36.0–46.0)
Hemoglobin: 9.3 g/dL — ABNORMAL LOW (ref 12.0–15.0)
MCH: 29.3 pg (ref 26.0–34.0)
MCHC: 33.7 g/dL (ref 30.0–36.0)
MCV: 87.1 fL (ref 80.0–100.0)
Platelets: 275 10*3/uL (ref 150–400)
RBC: 3.17 MIL/uL — ABNORMAL LOW (ref 3.87–5.11)
RDW: 14.2 % (ref 11.5–15.5)
WBC: 12.3 10*3/uL — ABNORMAL HIGH (ref 4.0–10.5)
nRBC: 0 % (ref 0.0–0.2)

## 2022-08-06 LAB — RPR: RPR Ser Ql: NONREACTIVE

## 2022-08-06 NOTE — Progress Notes (Signed)
Post Partum Day 1 Subjective: up ad lib, voiding, tolerating PO, and left groin pain improved w/ pain meds.   Objective: Blood pressure 104/73, pulse 77, temperature 98 F (36.7 C), temperature source Oral, resp. rate 18, height 5\' 2"  (1.575 m), weight 127.2 kg, last menstrual period 11/26/2021, SpO2 99 %, formula feeding.  Physical Exam:  General: alert, cooperative, appears stated age, no distress, and morbidly obese Lochia: appropriate Uterine Fundus: firm Incision: NA DVT Evaluation: No evidence of DVT seen on physical exam.  Recent Labs    08/05/22 1630 08/06/22 0529  HGB 11.6* 9.3*  HCT 35.0* 27.6*    Assessment/Plan: Plan for discharge tomorrow, Circumcision prior to discharge (consent obtained), and Contraception IUD at postpartum visit   LOS: 1 day   [place], [place] 08/06/2022, 11:50 AM

## 2022-08-07 MED ORDER — IBUPROFEN 600 MG PO TABS: 600.0000 mg | ORAL_TABLET | Freq: Four times a day (QID) | ORAL | 0 refills | Status: DC

## 2022-08-08 ENCOUNTER — Ambulatory Visit: Payer: Medicaid Other

## 2022-08-14 ENCOUNTER — Telehealth (HOSPITAL_COMMUNITY): Payer: Self-pay | Admitting: *Deleted

## 2022-08-14 NOTE — Telephone Encounter (Signed)
Unable to complete call.  Odis Hollingshead, RN 08-14-2022 at 11:43am

## 2022-08-15 ENCOUNTER — Encounter: Payer: Medicaid Other | Admitting: Family Medicine

## 2022-10-03 ENCOUNTER — Ambulatory Visit: Payer: Medicaid Other | Admitting: Obstetrics

## 2022-10-03 ENCOUNTER — Ambulatory Visit: Payer: Medicaid Other | Admitting: Obstetrics and Gynecology

## 2022-11-01 ENCOUNTER — Ambulatory Visit: Payer: Medicaid Other | Admitting: Student

## 2023-01-08 IMAGING — US US MFM OB DETAIL+14 WK
1 series · 12 of 28 positions shown · non-contrast
Comparison: none

[Series 1: us mfm ob detail+14 wk · 12 of 103 slices shown]
[im 4/103]
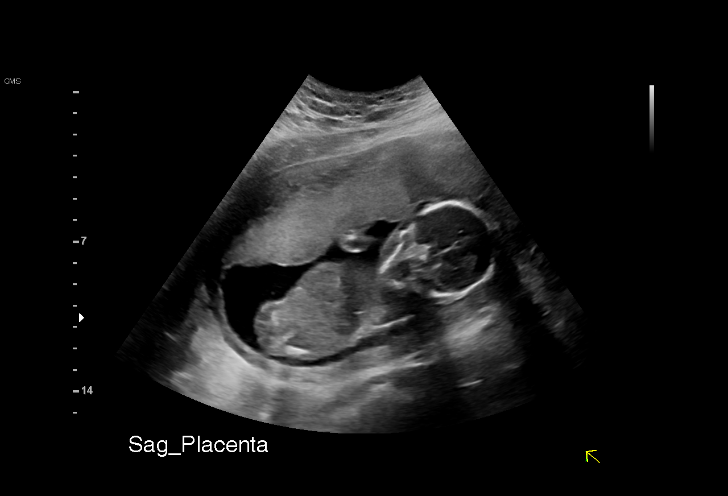
[im 12/103]
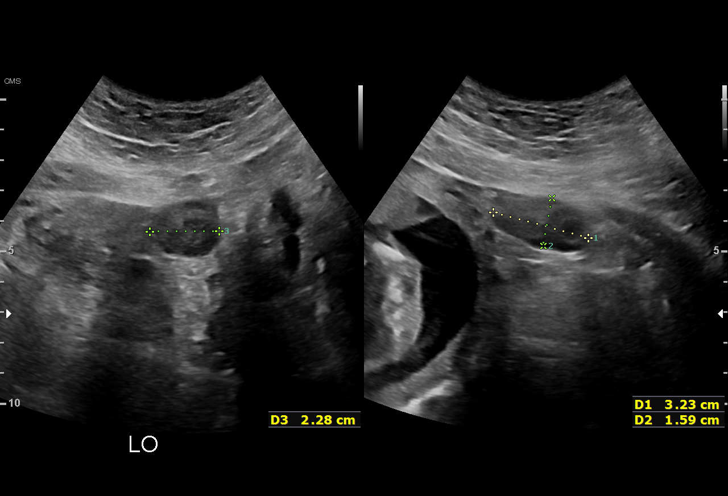
[im 19/103]
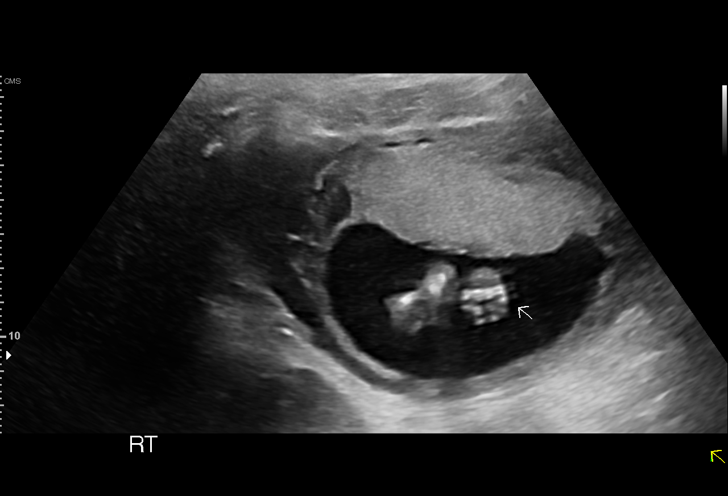
[im 31/103]
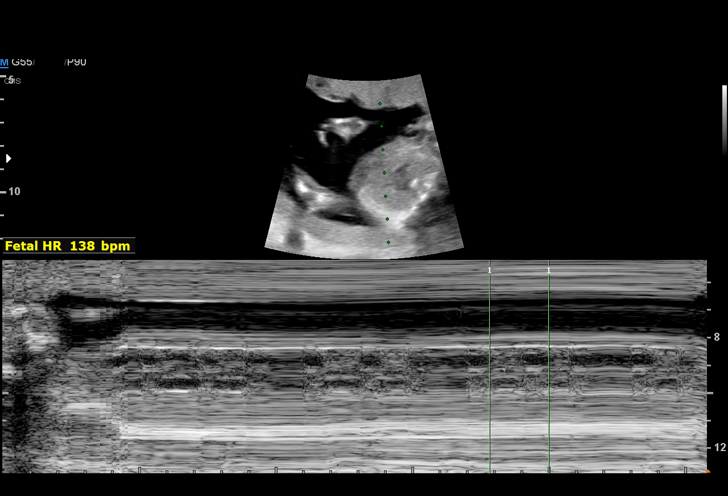
[im 38/103]
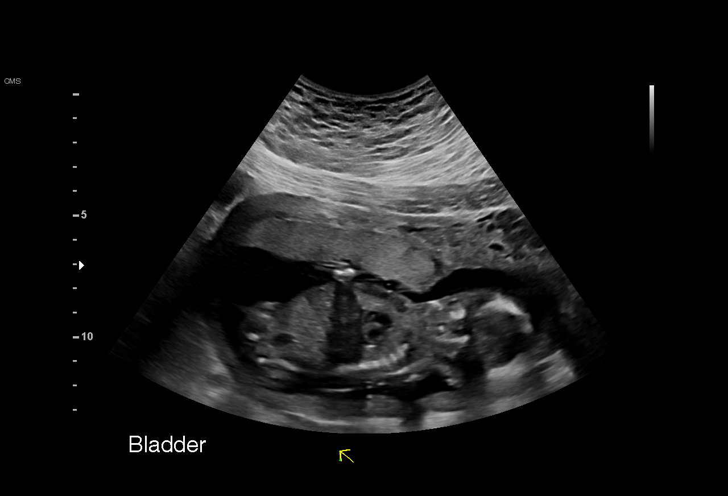
[im 46/103]
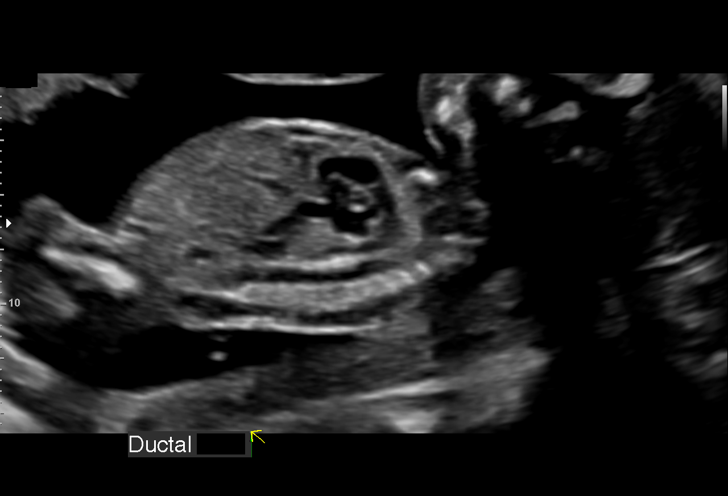
[im 57/103]
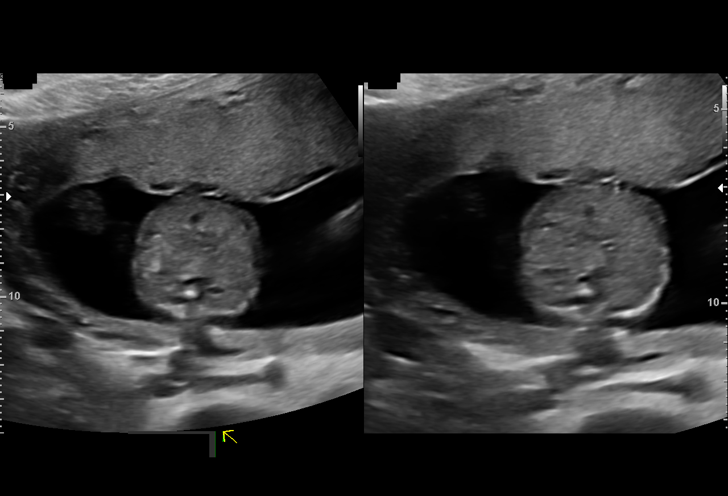
[im 65/103]
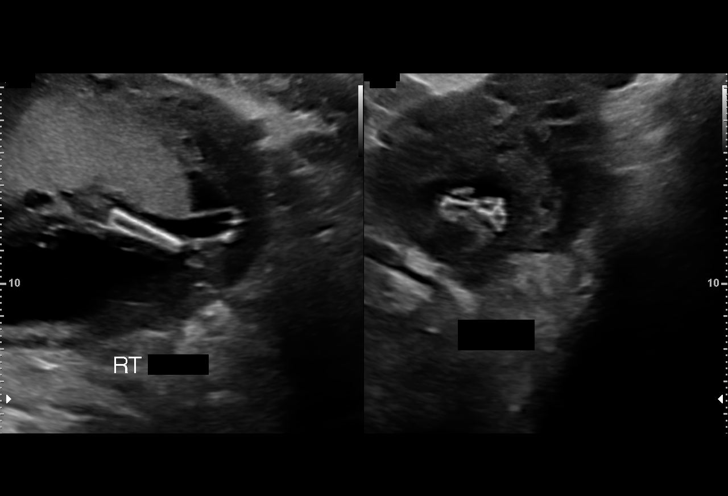
[im 72/103]
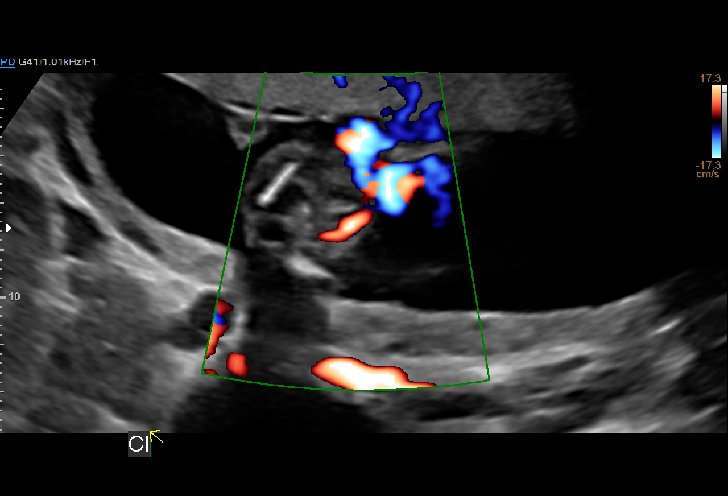
[im 84/103]
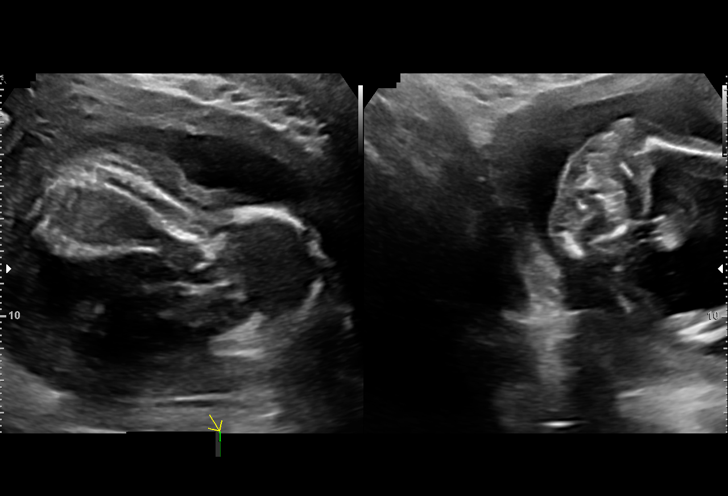
[im 91/103]
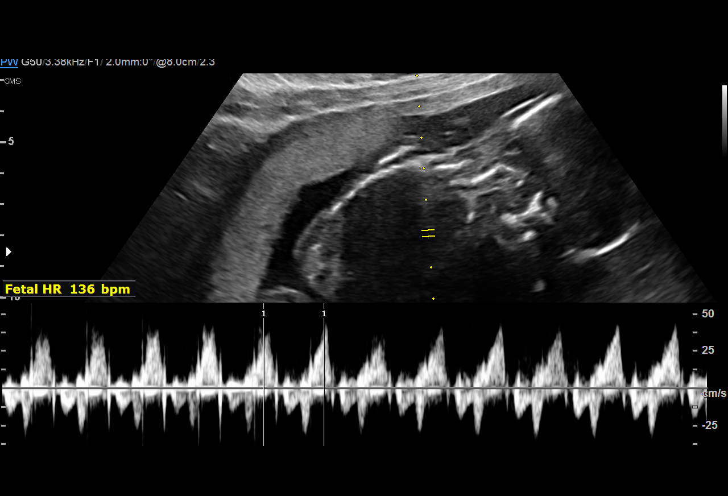
[im 99/103]
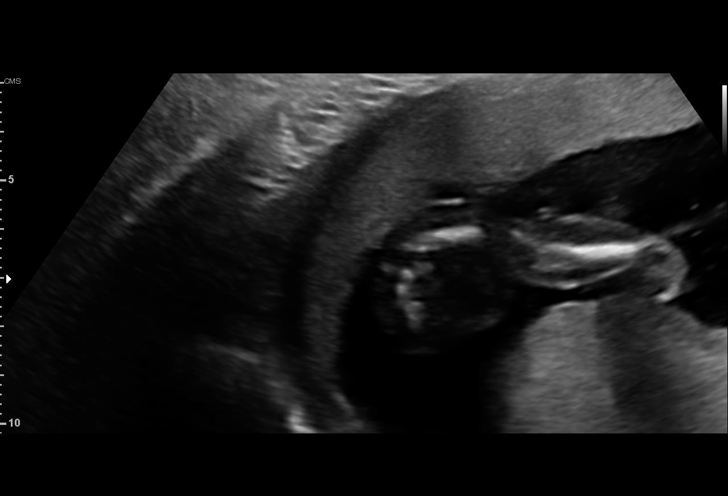

[12 of 28 positions shown; findings below may reference images not displayed]

Indications

 19 weeks gestation of pregnancy
 Antenatal screening for malformations
 Obesity complicating pregnancy, third
 trimester (pregravid BMI 46)
 Genetic carrier (silent Hamundjebo Adebayor)
 Fetal arrhythmia affecting pregnancy,
 antepartum
 Low risk NIPS
 Abnormal fetal ultrasound (absent nasal
 bone)
Fetal Evaluation

 Num Of Fetuses:         1
 Fetal Heart Rate(bpm):  138
 Cardiac Activity:       Arrhythmia noted
 Presentation:           Cephalic
 Placenta:               Anterior
 P. Cord Insertion:      Visualized

 Amniotic Fluid
 AFI FV:      Within normal limits

                             Largest Pocket(cm)

Biometry

 BPD:      41.3  mm     G. Age:  18w 4d         29  %    CI:        71.76   %    70 - 86
                                                         FL/HC:      17.3   %    16.1 -
 HC:      155.2  mm     G. Age:  18w 3d         19  %    HC/AC:      1.17        1.09 -
 AC:      132.7  mm     G. Age:  18w 5d         38  %    FL/BPD:     65.1   %
 FL:       26.9  mm     G. Age:  18w 1d         17  %    FL/AC:      20.3   %    20 - 24
 HUM:      26.7  mm     G. Age:  18w 3d         35  %
 CER:      18.6  mm     G. Age:  18w 2d         10  %
 NFT:       3.4  mm
 LV:        8.2  mm
 CM:        4.8  mm

 Est. FW:     243  gm      0 lb 9 oz     20  %
OB History

 Gravidity:    2         Term:   0        Prem:   1        SAB:   0
 TOP:          0       Ectopic:  0        Living: 1
Gestational Age

 LMP:           17w 5d        Date:  12/30/20                 EDD:   10/06/21
 U/S Today:     18w 3d                                        EDD:   10/01/21
 Best:          19w 0d     Det. By:  U/S C R L  (03/14/21)    EDD:   09/27/21
Anatomy

 Cranium:               Appears normal         LVOT:                   Appears normal
 Cavum:                 Appears normal         Aortic Arch:            Appears normal
 Ventricles:            Appears normal         Ductal Arch:            Appears normal
 Choroid Plexus:        Appears normal         Diaphragm:              Appears normal
 Cerebellum:            Appears normal         Stomach:                Appears normal, left
                                                                       sided
 Posterior Fossa:       Appears normal         Abdomen:                Appears normal
 Nuchal Fold:           Appears normal         Abdominal Wall:         Not well visualized
 Face:                  Absent nasal           Cord Vessels:           Appears normal (3
                        bone; Orbits
                        normal
                                                                       vessel cord)

 Lips:                  Appears normal         Kidneys:                Appear normal
 Palate:                Not well visualized    Bladder:                Appears normal
 Thoracic:              Appears normal         Spine:                  Appears normal
 Heart:                 Appears normal         Upper Extremities:      Appears normal
                        (4CH, axis, and
                        situs)
 RVOT:                  Not well visualized    Lower Extremities:      Appears normal

 Other:  Technically difficult due to maternal habitus and fetal position.
Targeted Anatomy

 Thorax
 SVC:                   Appears normal         3 V Trachea View:       Appears normal
 Interventr. Septum:    Appears normal         IVC:                    Appears normal
 3 Vessel View:         Appears normal

 Extremities
 Lt Humerus:            Appears normal         Lt Femur:               Appears normal
 Rt Humerus:            Appears normal         Rt Femur:               Appears normal
 Lt Forearm:            Appears normal         Lt Lower Leg:           Appears normal
 Rt Forearm:            Appears normal         Rt Lower Leg:           Appears normal
 Lt Hand:               Appears normal         Lt Foot:                Appears normal
 Rt Hand:               Appears normal         Rt Foot:                Appears normal
Cervix Uterus Adnexa

 Cervix
 Length:           4.17  cm.
 Normal appearance by transabdominal scan.

 Uterus
 No abnormality visualized.

 Right Ovary
 Within normal limits.

 Left Ovary
 Within normal limits.

 Cul De Sac
 No free fluid seen.

 Adnexa
 No abnormality visualized.
Impression

 G2 P1.  Patient is here for fetal anatomy scan.
 On cell-free fetal DNA screening, the risks of aneuploidies
 are not increased. MSAFP screening showed low risk for
 open-neural tube defects.
 Obstetric history significant for a preterm vaginal delivery at
 36 weeks gestation.

 We performed fetal anatomy scan.  Nasal bone is absent.
 No other markers of aneuploidies or obvious fetal structural
 defects are seen.  Fetal biometry is consistent with the
 previously established dates.  Amniotic fluid is normal and
 good fetal activity seen.

 Fetal heart arrhythmia was seen that was not sustained.

 I counseled the patient on the finding of absent nasal bone
 that it is a marker for Down syndrome.  It can also be absent
 in some normal fetuses in African-American population.

 Given that she had low risk for fetal Down syndrome on cell
 free fetal DNA screening, the likelihood of fetal Down
 syndrome is very low.
 I informed the patient that only amniocentesis will give a
 definitive result on the fetal karyotype.  Discussed the
 procedure and possible complications including miscarriage
 (1 and 500 procedures).
 Patient opted not to have amniocentesis.

 Patient admitted that she drinks caffeinated drinks (soda).  I
 asked her to reduce or stop consumption of caffeinated
 drinks that can lead to fetal heart arrhythmias.

 As maternal obesity limits resolution of images, failure to
 detect anomalies are more common .
Recommendations

 -An appointment was made for her to return in 4 weeks for
 completion of fetal anatomy.
                 [place], Manuel Mauricio

## 2023-01-11 ENCOUNTER — Other Ambulatory Visit (HOSPITAL_COMMUNITY)
Admission: RE | Admit: 2023-01-11 | Discharge: 2023-01-11 | Disposition: A | Discharge status: Home / Self Care | Payer: No Typology Code available for payment source | Source: Ambulatory Visit | Attending: Obstetrics & Gynecology | Admitting: Obstetrics & Gynecology

## 2023-01-11 ENCOUNTER — Ambulatory Visit (INDEPENDENT_AMBULATORY_CARE_PROVIDER_SITE_OTHER): Payer: No Typology Code available for payment source | Admitting: *Deleted

## 2023-01-11 ENCOUNTER — Ambulatory Visit (INDEPENDENT_AMBULATORY_CARE_PROVIDER_SITE_OTHER): Payer: No Typology Code available for payment source

## 2023-01-11 VITALS — BP 130/83 | HR 87 | Ht 62.0 in | Wt 274.2 lb

## 2023-01-11 DIAGNOSIS — O0990 Supervision of high risk pregnancy, unspecified, unspecified trimester: Secondary | ICD-10-CM

## 2023-01-11 DIAGNOSIS — O3680X0 Pregnancy with inconclusive fetal viability, not applicable or unspecified: Secondary | ICD-10-CM

## 2023-01-11 DIAGNOSIS — Z3A16 16 weeks gestation of pregnancy: Secondary | ICD-10-CM

## 2023-01-11 DIAGNOSIS — Z3482 Encounter for supervision of other normal pregnancy, second trimester: Secondary | ICD-10-CM

## 2023-01-11 DIAGNOSIS — O0992 Supervision of high risk pregnancy, unspecified, second trimester: Secondary | ICD-10-CM | POA: Diagnosis not present

## 2023-01-11 DIAGNOSIS — Z3201 Encounter for pregnancy test, result positive: Secondary | ICD-10-CM

## 2023-01-11 LAB — POCT URINE PREGNANCY: Preg Test, Ur: POSITIVE — AB

## 2023-01-11 MED ORDER — VITAFOL ULTRA 29-0.6-0.4-200 MG PO CAPS: 1.0000 | ORAL_CAPSULE | Freq: Every day | ORAL | 11 refills | Status: DC

## 2023-01-11 MED ORDER — ASPIRIN 81 MG PO TBEC: 81.0000 mg | DELAYED_RELEASE_TABLET | Freq: Every day | ORAL | 2 refills | Status: DC

## 2023-01-11 NOTE — Progress Notes (Signed)
New OB Intake  I connected with Molly Sims  on 01/11/23 at  8:15 AM EST by In Person Visit and verified that I am speaking with the correct person using two identifiers. Nurse is located at [facility] and pt is located at [name].  I discussed the limitations, risks, security and privacy concerns of performing an evaluation and management service by telephone and the availability of in person appointments. I also discussed with the patient that there may be a patient responsible charge related to this service. The patient expressed understanding and agreed to proceed.  I explained I am completing New OB Intake today. We discussed EDD of 06/26/23 that is based on [place] at 57w2don 01/11/23. Pt is G4/P3. I reviewed her allergies, medications, Medical/Surgical/OB history, and appropriate screenings. I informed [facility] of [facility] BThe [facility] Incinformation placed in AVS. Based on history, this is a high risk pregnancy.  Patient Active Problem List   Diagnosis Date Noted   Preterm labor 08/05/2022   Preterm delivery 08/05/2022   Polyhydramnios affecting pregnancy 05/23/2022   BMI 45.0-49.9, adult ([place] 03/14/2022   History of premature delivery, currently pregnant, second trimester 03/14/2022   Short interval between pregnancies affecting pregnancy in second trimester, antepartum 03/14/2022   Supervision of normal pregnancy 02/23/2022   Vaginal delivery 08/24/2021   Obesity in pregnancy 08/24/2021    Concerns addressed today  [facility] Patient given information for [facility] for more information about [facility] and [place] Patient is not interested in water birth. Offered upcoming OB visit with CNM to discuss further.  MyChart/Babyscripts MyChart access verified. I explained pt will have some visits in office and some virtually. Babyscripts instructions given and order placed. Patient verifies receipt of registration text/e-mail. Account  successfully created and app downloaded.  Blood Pressure Cuff/Weight Scale Patient has private insurance; instructed to purchase blood pressure cuff and bring to first prenatal appt. Explained after first prenatal appt pt will check weekly and document in B58 Patient does not have weight scale; patient may purchase if they desire to track weight weekly in Babyscripts.  Anatomy [place] first scheduled [place] be around 19 weeks. Anatomy [place]cheduled for 02/26/23 at [facility]. Pt notified to arrive at 7:30.  Labs Discussed NJohnsie Cancelgenetic screening with patient. Would like both Panorama and Horizon drawn at new OB visit. Routine prenatal labs needed.  COVID Vaccine Patient has had COVID vaccine.  Social Determinants of Health Food Insecurity: Patient denies food insecurity. WIC Referral: [facility]  Transportation: Patient denies transportation needs. Childcare: Discussed no children allowed at ultrasound appointments. Offered childcare services; patient declines childcare services at this time.  Interested in [place] yes, send referral.   First visit review I reviewed new OB appt with patient. I explained they will have a provider visit that includes Pap, AFP, discussion with provider. Explained pt will be seen by Dr. ARoselie Awkwardat first visit; encounter routed to appropriate provider. Explained that patient will be seen by pregnancy navigator following visit with provider.   EPenny Pia RN 01/11/2023  8:51 AM

## 2023-01-12 LAB — CBC/D/PLT+RPR+RH+ABO+RUBIGG...
Antibody Screen: NEGATIVE
Basophils Absolute: 0 10*3/uL (ref 0.0–0.2)
Basos: 0 %
EOS (ABSOLUTE): 0.1 10*3/uL (ref 0.0–0.4)
Eos: 2 %
HCV Ab: NONREACTIVE
HIV Screen 4th Generation wRfx: NONREACTIVE
Hematocrit: 36.4 % (ref 34.0–46.6)
Hemoglobin: 11.6 g/dL (ref 11.1–15.9)
Hepatitis B Surface Ag: NEGATIVE
Immature Grans (Abs): 0 10*3/uL (ref 0.0–0.1)
Immature Granulocytes: 0 %
Lymphocytes Absolute: 2.1 10*3/uL (ref 0.7–3.1)
Lymphs: 21 %
MCH: 27.8 pg (ref 26.6–33.0)
MCHC: 31.9 g/dL (ref 31.5–35.7)
MCV: 87 fL (ref 79–97)
Monocytes Absolute: 0.5 10*3/uL (ref 0.1–0.9)
Monocytes: 6 %
Neutrophils Absolute: 6.9 10*3/uL (ref 1.4–7.0)
Neutrophils: 71 %
Platelets: 343 10*3/uL (ref 150–450)
RBC: 4.17 x10E6/uL (ref 3.77–5.28)
RDW: 14.1 % (ref 11.7–15.4)
RPR Ser Ql: NONREACTIVE
Rh Factor: POSITIVE
Rubella Antibodies, IGG: 1.16 index (ref >0.99)
WBC: 9.6 10*3/uL (ref 3.4–10.8)

## 2023-01-12 LAB — CERVICOVAGINAL ANCILLARY ONLY
Chlamydia: NEGATIVE
Comment: NEGATIVE
Comment: NORMAL
Neisseria Gonorrhea: NEGATIVE

## 2023-01-12 LAB — COMPREHENSIVE METABOLIC PANEL
ALT: 8 IU/L (ref 0–32)
AST: 8 IU/L (ref 0–40)
Albumin/Globulin Ratio: 1.3 (ref 1.2–2.2)
Albumin: 3.7 g/dL — ABNORMAL LOW (ref 3.9–4.9)
Alkaline Phosphatase: 32 IU/L — ABNORMAL LOW (ref 44–121)
BUN/Creatinine Ratio: 27 — ABNORMAL HIGH (ref 9–23)
BUN: 15 mg/dL (ref 6–20)
Bilirubin Total: <0.2 mg/dL (ref 0.0–1.2)
CO2: 20 mmol/L (ref 20–29)
Calcium: 8.9 mg/dL (ref 8.7–10.2)
Chloride: 104 mmol/L (ref 96–106)
Creatinine, Ser: 0.55 mg/dL — ABNORMAL LOW (ref 0.57–1.00)
Globulin, Total: 2.8 g/dL (ref 1.5–4.5)
Glucose: 76 mg/dL (ref 70–99)
Potassium: 4.2 mmol/L (ref 3.5–5.2)
Sodium: 138 mmol/L (ref 134–144)
Total Protein: 6.5 g/dL (ref 6.0–8.5)
eGFR: 123 mL/min/{1.73_m2} (ref >59)

## 2023-01-12 LAB — HCV INTERPRETATION

## 2023-01-12 LAB — HEMOGLOBIN A1C
Est. average glucose Bld gHb Est-mCnc: 111 mg/dL
Hgb A1c MFr Bld: 5.5 % (ref 4.8–5.6)

## 2023-01-13 LAB — URINE CULTURE, OB REFLEX

## 2023-01-13 LAB — CULTURE, OB URINE

## 2023-01-15 ENCOUNTER — Other Ambulatory Visit: Payer: Self-pay

## 2023-01-15 ENCOUNTER — Encounter: Payer: Self-pay | Admitting: Advanced Practice Midwife

## 2023-01-15 ENCOUNTER — Telehealth: Payer: Self-pay

## 2023-01-15 ENCOUNTER — Ambulatory Visit: Payer: No Typology Code available for payment source | Admitting: Advanced Practice Midwife

## 2023-01-15 VITALS — BP 127/75 | HR 91 | Wt 267.4 lb

## 2023-01-15 DIAGNOSIS — O0990 Supervision of high risk pregnancy, unspecified, unspecified trimester: Secondary | ICD-10-CM

## 2023-01-15 DIAGNOSIS — O0992 Supervision of high risk pregnancy, unspecified, second trimester: Secondary | ICD-10-CM

## 2023-01-15 NOTE — Progress Notes (Signed)
Pt reports bumps on her vaginal area that appeared approx 4 days ago. Pt denies new partner, change in soap, or shaving. No other concerns at this time.

## 2023-01-15 NOTE — Telephone Encounter (Signed)
Patient called stating that she is having vaginal irritation and that she has some bumps around her clitoris area. She states that bumps are itchy, but not painful. Denies having any drainage. Patient states that she did not inform nurse of sx from previous visit because she assumed that everything would be tested on the swab.

## 2023-01-15 NOTE — Progress Notes (Signed)
Pt rescheduled for 01/16/23 to see provider due to schedule constraints today.

## 2023-01-16 ENCOUNTER — Encounter: Payer: Self-pay | Admitting: Obstetrics and Gynecology

## 2023-01-16 ENCOUNTER — Ambulatory Visit: Payer: No Typology Code available for payment source | Admitting: Obstetrics and Gynecology

## 2023-01-16 VITALS — BP 127/78 | HR 80 | Wt 268.0 lb

## 2023-01-16 DIAGNOSIS — A630 Anogenital (venereal) warts: Secondary | ICD-10-CM | POA: Diagnosis not present

## 2023-01-16 DIAGNOSIS — O0990 Supervision of high risk pregnancy, unspecified, unspecified trimester: Secondary | ICD-10-CM

## 2023-01-16 MED ORDER — IMIQUIMOD 5 % EX CREA: TOPICAL_CREAM | CUTANEOUS | 5 refills | Status: DC

## 2023-01-16 NOTE — Progress Notes (Signed)
37 yo [id number] at 17 weeks here for evaluation of vaginal lesions which appeared 5 days ago. Patient denies pain or pruritus. She denies any vaginal discharge. She is without any other complaints. She denies shaving the area.  Past Medical History:  Diagnosis Date   Anemia    Hypertension    in 3rd pregnancy   Past Surgical History:  Procedure Laterality Date   WISDOM TOOTH EXTRACTION     Family History  Problem Relation Age of Onset   Hypertension Paternal Grandfather    Diabetes Paternal Grandmother    Hypertension Paternal Grandmother    Stroke Maternal Grandmother    Hypertension Maternal Grandmother    Hypertension Maternal Grandfather    Hypertension Mother    Social History   Tobacco Use   Smoking status: Never   Smokeless tobacco: Never  Vaping Use   Vaping Use: Never used  Substance Use Topics   Alcohol use: Not Currently    Comment: Not since confirmed pregnancy   Drug use: Not Currently    Types: Marijuana    Comment: none since pregnancy was confirmed   ROS See pertinent in HPI. All other systems reviewed and non contributory Blood pressure 127/78, pulse 80, weight 268 lb (121.6 kg), last menstrual period 09/27/2022, unknown if currently breastfeeding.  GENERAL: Well-developed, well-nourished female in no acute distress.  ABDOMEN: Soft, nontender, nondistended. No organomegaly. PELVIC: Normal external female genitalia with several 0.5 cm lesions in the upper aspect of bilateral labia majora. Non tender, flesh colored. Vagina is pink and rugated.  Normal discharge. Chaperone present during the pelvic exam EXTREMITIES: No cyanosis, clubbing, or edema, 2+ distal pulses.  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 Patient opted to start with Aldara cream and may consider TCA at a later date - Rx provided - Patient scheduled to start prenatal care on 4/2

## 2023-01-16 NOTE — Progress Notes (Signed)
ROB 17w  CC: vaginal bumps, no pain , no bleeding.

## 2023-01-17 LAB — PANORAMA PRENATAL TEST FULL PANEL:PANORAMA TEST PLUS 5 ADDITIONAL MICRODELETIONS: FETAL FRACTION: 7.6

## 2023-02-07 ENCOUNTER — Other Ambulatory Visit: Payer: Self-pay | Admitting: Lactation Services

## 2023-02-07 NOTE — Progress Notes (Signed)
Vitamin RX resent to a tablet covered by Patients insurance plan.

## 2023-02-13 ENCOUNTER — Encounter: Payer: Self-pay | Admitting: Obstetrics & Gynecology

## 2023-02-13 ENCOUNTER — Ambulatory Visit (INDEPENDENT_AMBULATORY_CARE_PROVIDER_SITE_OTHER): Payer: No Typology Code available for payment source | Admitting: Obstetrics & Gynecology

## 2023-02-13 VITALS — BP 120/86 | HR 90 | Wt 273.0 lb

## 2023-02-13 DIAGNOSIS — A630 Anogenital (venereal) warts: Secondary | ICD-10-CM

## 2023-02-13 DIAGNOSIS — O0990 Supervision of high risk pregnancy, unspecified, unspecified trimester: Secondary | ICD-10-CM

## 2023-02-13 DIAGNOSIS — O98312 Other infections with a predominantly sexual mode of transmission complicating pregnancy, second trimester: Secondary | ICD-10-CM

## 2023-02-13 DIAGNOSIS — O09892 Supervision of other high risk pregnancies, second trimester: Secondary | ICD-10-CM

## 2023-02-13 MED ORDER — PRENATAL PLUS VITAMIN/MINERAL 27-1 MG PO TABS: 1.0000 | ORAL_TABLET | Freq: Every day | ORAL | 11 refills | Status: AC

## 2023-02-13 NOTE — Progress Notes (Signed)
Pt presents for NOB. Wants to defer Pap today, due to vaginal lesions.

## 2023-02-13 NOTE — Progress Notes (Signed)
  Subjective:    Molly Sims is a [id number] [redacted]w[redacted]d being seen today for her first obstetrical visit.  Her obstetrical history is significant for advanced maternal age and preterm delivery . Patient does intend to breast feed. Pregnancy history fully reviewed.  Patient reports no complaints.  Vitals:   02/13/23 0835  BP: 120/86  Pulse: 90  Weight: 273 lb (123.8 kg)    HISTORY: OB History  Gravida Para Term Preterm AB Living  4 3 1 2   3   SAB IAB Ectopic Multiple Live Births        0 3    # Outcome Date GA Lbr Len/2nd Weight Sex Delivery Anes PTL Lv  4 Current           3 Preterm 08/05/22 [redacted]w[redacted]d 05:43 / 00:07 4 lb 14.7 oz (2.23 kg) M Vag-Spont None  LIV  2 Preterm 08/24/21 [redacted]w[redacted]d / 00:24 4 lb 5.8 oz (1.98 kg) F Vag-Spont EPI  LIV  1 Term 05/13/07 [redacted]w[redacted]d  5 lb 2 oz (2.325 kg) M Vag-Spont   LIV   Past Medical History:  Diagnosis Date   Anemia    Hypertension    in 3rd pregnancy   Past Surgical History:  Procedure Laterality Date   WISDOM TOOTH EXTRACTION     Family History  Problem Relation Age of Onset   Hypertension Paternal Grandfather    Diabetes Paternal Grandmother    Hypertension Paternal Grandmother    Stroke Maternal Grandmother    Hypertension Maternal Grandmother    Hypertension Maternal Grandfather    Hypertension Mother      Exam    Uterus:   21  Pelvic Exam: Deferred per her request   Perineum:    Vulva:    Vagina:     pH:    Cervix:    Adnexa:    Bony Pelvis:   System: Breast:     Skin: normal coloration and turgor, no rashes    Neurologic: oriented, normal mood   Extremities: normal strength, tone, and muscle mass   HEENT PERRLA and extra ocular movement intact   Mouth/Teeth mucous membranes moist, pharynx normal without lesions and dental hygiene good   Neck supple   Cardiovascular: regular rate and rhythm, no murmurs or gallops   Respiratory:  appears well, vitals normal, no respiratory distress, acyanotic, normal RR, chest clear, no  wheezing, crepitations, rhonchi, normal symmetric air entry   Abdomen: soft, non-tender; bowel sounds normal; no masses,  no organomegaly   Urinary:       Assessment:    Pregnancy: [id number] Patient Active Problem List   Diagnosis Date Noted   Condyloma acuminata of vulva during pregnancy in second trimester 01/16/2023   Supervision of high risk pregnancy, antepartum 01/11/2023   Preterm labor 08/05/2022   Preterm delivery 08/05/2022   Polyhydramnios affecting pregnancy 05/23/2022   BMI 45.0-49.9, adult 03/14/2022   History of premature delivery, currently pregnant, second trimester 03/14/2022   Short interval between pregnancies affecting pregnancy in second trimester, antepartum 03/14/2022   Obesity in pregnancy 08/24/2021        Plan:     Initial labs drawn. Prenatal vitamins. Problem list reviewed and updated. Genetic Screening discussed : ordered.  Ultrasound discussed; fetal survey: ordered.  Follow up in 4 weeks. 50% of 30 min visit spent on counseling and coordination of care.  F/u US for growth and anatomy   Emeterio Reeve 02/13/2023

## 2023-02-19 LAB — AFP, SERUM, OPEN SPINA BIFIDA
AFP MoM: 1.28
AFP Value: 66 ng/mL
Gest. Age on Collection Date: 21 weeks
Maternal Age At EDD: 35.9 yr
OSBR Risk 1 IN: 10000
Test Results:: NEGATIVE
Weight: 273 [lb_av]

## 2023-02-26 ENCOUNTER — Ambulatory Visit: Payer: No Typology Code available for payment source | Attending: Obstetrics & Gynecology

## 2023-02-26 ENCOUNTER — Ambulatory Visit: Payer: No Typology Code available for payment source | Admitting: *Deleted

## 2023-02-26 ENCOUNTER — Other Ambulatory Visit: Payer: Self-pay | Admitting: *Deleted

## 2023-02-26 DIAGNOSIS — O285 Abnormal chromosomal and genetic finding on antenatal screening of mother: Secondary | ICD-10-CM

## 2023-02-26 DIAGNOSIS — O09292 Supervision of pregnancy with other poor reproductive or obstetric history, second trimester: Secondary | ICD-10-CM

## 2023-02-26 DIAGNOSIS — O09899 Supervision of other high risk pregnancies, unspecified trimester: Secondary | ICD-10-CM

## 2023-02-26 DIAGNOSIS — O99212 Obesity complicating pregnancy, second trimester: Secondary | ICD-10-CM

## 2023-02-26 DIAGNOSIS — O09522 Supervision of elderly multigravida, second trimester: Secondary | ICD-10-CM

## 2023-02-26 DIAGNOSIS — Z8759 Personal history of other complications of pregnancy, childbirth and the puerperium: Secondary | ICD-10-CM

## 2023-02-26 DIAGNOSIS — O0990 Supervision of high risk pregnancy, unspecified, unspecified trimester: Secondary | ICD-10-CM

## 2023-02-26 DIAGNOSIS — D563 Thalassemia minor: Secondary | ICD-10-CM

## 2023-02-26 DIAGNOSIS — Z3A22 22 weeks gestation of pregnancy: Secondary | ICD-10-CM

## 2023-02-26 DIAGNOSIS — O09212 Supervision of pregnancy with history of pre-term labor, second trimester: Secondary | ICD-10-CM

## 2023-02-26 DIAGNOSIS — E669 Obesity, unspecified: Secondary | ICD-10-CM

## 2023-02-26 DIAGNOSIS — Z8679 Personal history of other diseases of the circulatory system: Secondary | ICD-10-CM

## 2023-03-11 IMAGING — US US MFM OB FOLLOW-UP
1 series · 14 of 28 positions shown · non-contrast
Comparison: none

[Series 1: us mfm ob follow-up · 81 acquisitions, 14 frames shown]
[im 3/81]
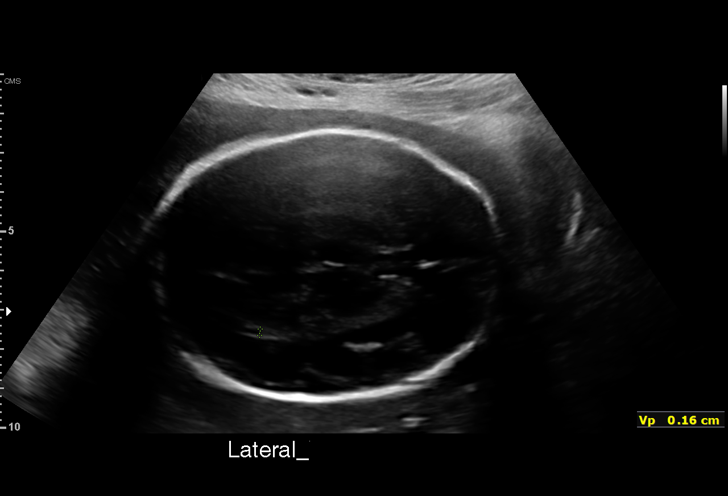
[im 9/81]
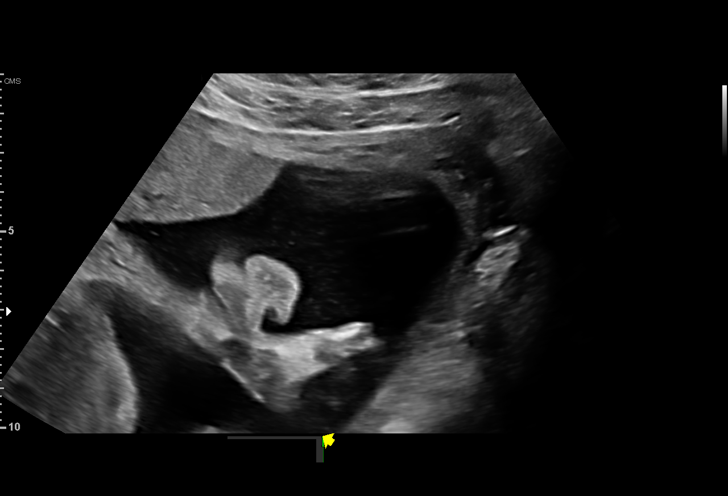
[im 15/81]
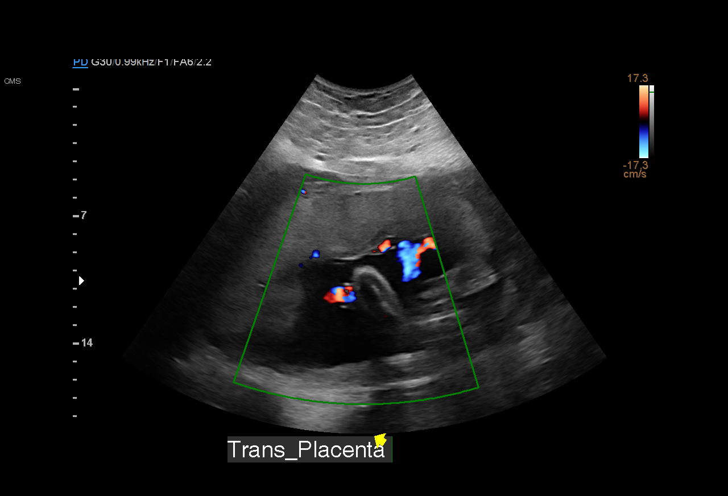
[im 21/81]
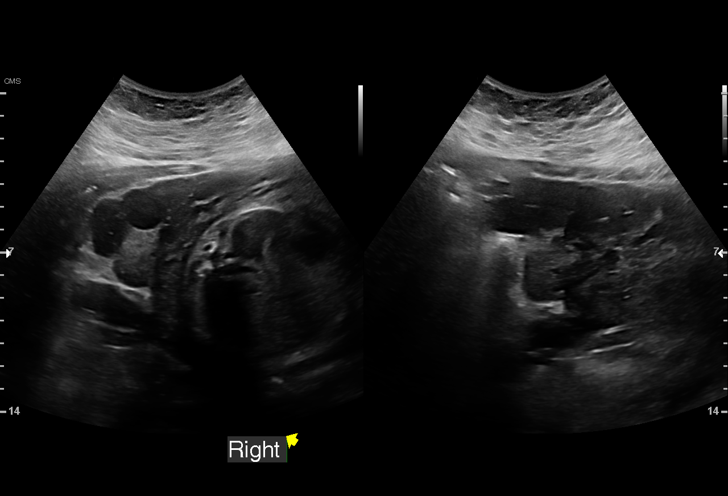
[im 27/81]
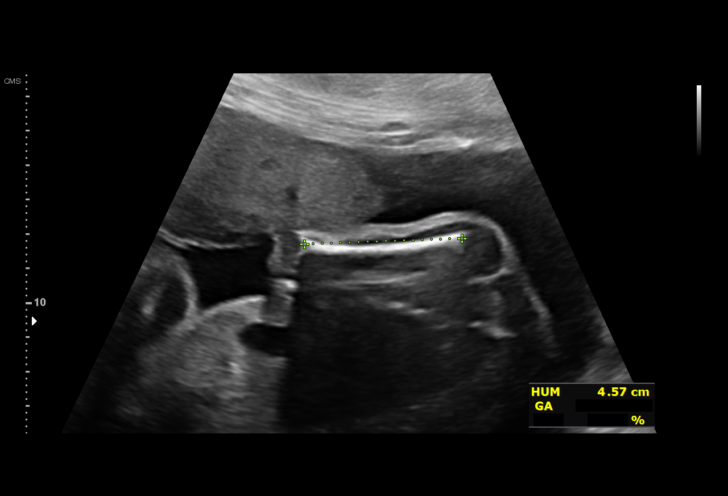
[im 33/81]
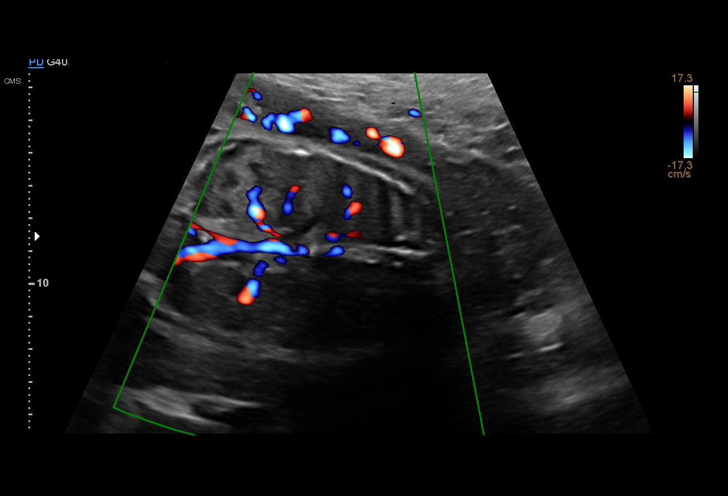
[im 39/81]
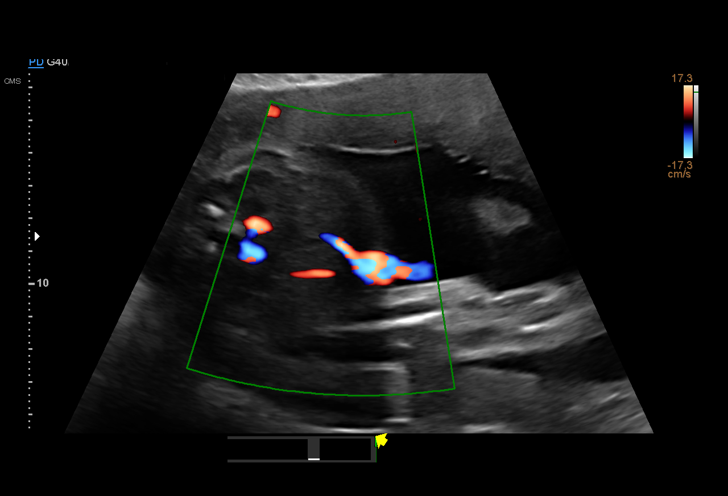
[im 45/81]
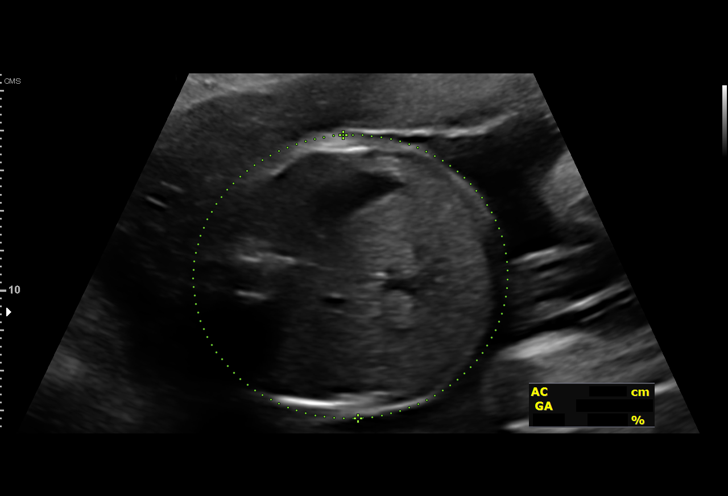
[im 51/81]
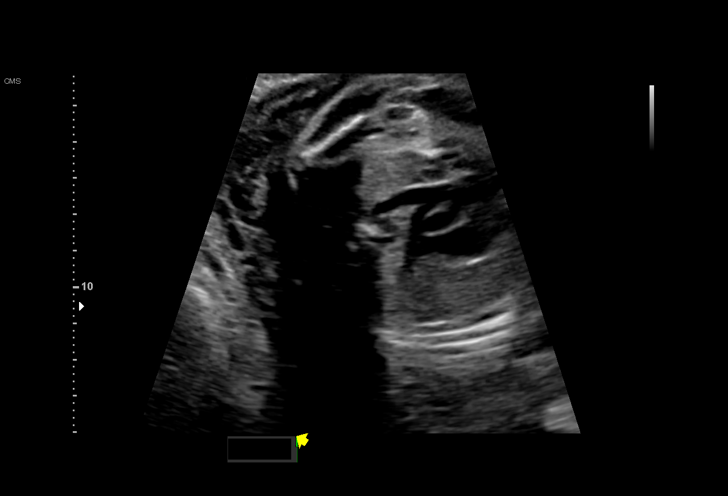
[im 57/81]
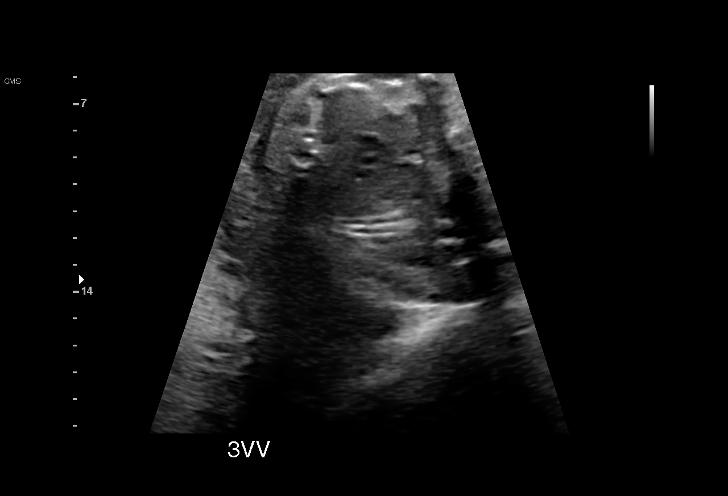
[im 63/81]
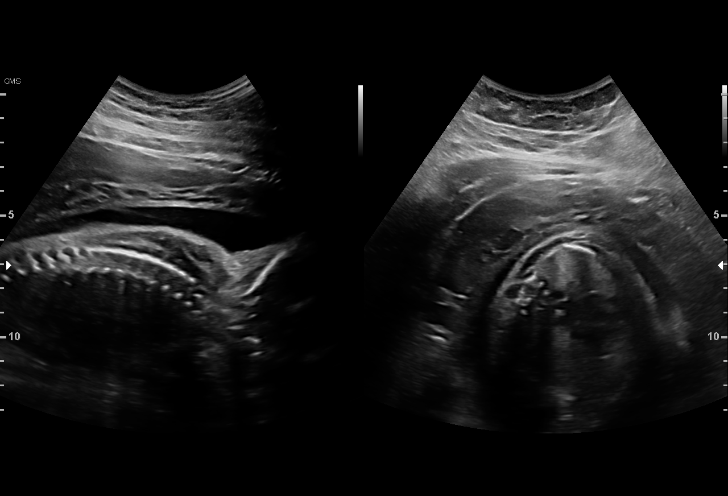
[im 69/81]
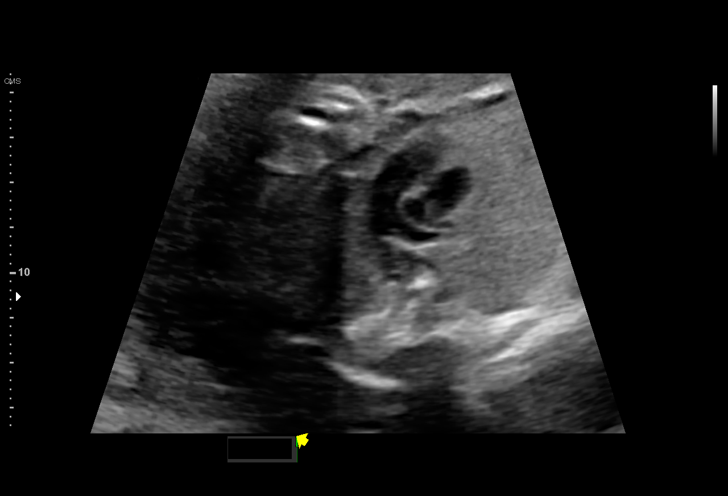
[im 75/81]
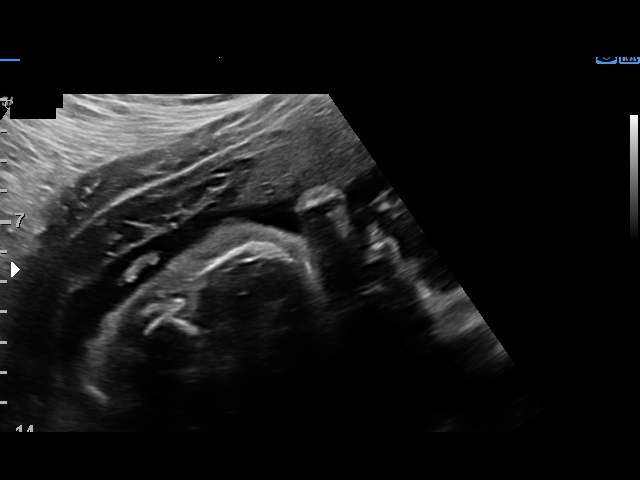
[im 81/81]
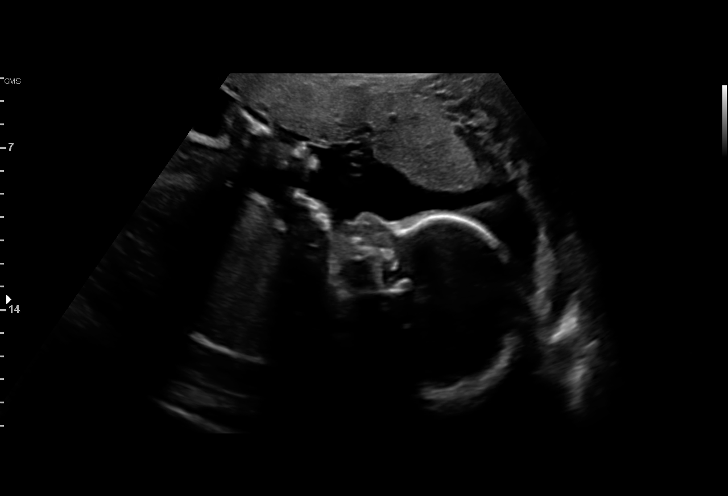

[14 of 28 positions shown; findings below may reference images not displayed]

Indications

 27 weeks gestation of pregnancy
 Obesity complicating pregnancy (pregravid
 BMI 46)
 Genetic carrier (silent Blain Jumper) - declined
 CORNEIL
 Fetal arrhythmia affecting pregnancy,
 antepartum
 Low risk NIPS, neg AFP
 Abnormal fetal ultrasound (absent nasal
 bone)
Fetal Evaluation

 Num Of Fetuses:         1
 Fetal Heart Rate(bpm):  160
 Cardiac Activity:       Observed
 Presentation:           Cephalic
 Placenta:               Anterior
 P. Cord Insertion:      Visualized

 Amniotic Fluid
 AFI FV:      Within normal limits

                             Largest Pocket(cm)

Biometry
 BPD:      67.2  mm     G. Age:  27w 0d         16  %    CI:        71.29   %    70 - 86
                                                         FL/HC:      19.3   %    18.8 -
 HC:      253.5  mm     G. Age:  27w 4d         13  %    HC/AC:      1.09        1.05 -
 AC:       233   mm     G. Age:  27w 4d         35  %    FL/BPD:     72.6   %    71 - 87
 FL:       48.8  mm     G. Age:  26w 3d          6  %    FL/AC:      20.9   %    20 - 24
 HUM:      45.9  mm     G. Age:  27w 0d         30  %
 LV:        1.6  mm

 Est. FW:    1168  gm      2 lb 5 oz     16  %
OB History

 Gravidity:    2         Term:   0        Prem:   1        SAB:   0
 TOP:          0       Ectopic:  0        Living: 1
Gestational Age

 LMP:           26w 4d        Date:  12/30/20                 EDD:   10/06/21
 U/S Today:     27w 1d                                        EDD:   10/02/21
 Best:          27w 6d     Det. By:  U/S C R L  (03/14/21)    EDD:   09/27/21
Anatomy

 Cranium:               Appears normal         LVOT:                   Previously seen
 Cavum:                 Appears normal         Aortic Arch:            Appears normal
 Ventricles:            Appears normal         Ductal Arch:            Appears normal
 Choroid Plexus:        Previously seen        Diaphragm:              Appears normal
 Cerebellum:            Appears normal         Stomach:                Appears normal, left
                                                                       sided
 Posterior Fossa:       Appears normal         Abdomen:                Appears normal
 Nuchal Fold:           Previously seen        Abdominal Wall:         Appears nml (cord
                                                                       insert, abd wall)
 Face:                  Absent nasal           Cord Vessels:           Appears normal (3
                        bone; orbits nml
                                                                       vessel cord)
 Lips:                  Appears normal         Kidneys:                Appear normal
 Palate:                Not well visualized    Bladder:                Appears normal
 Thoracic:              Previously seen        Spine:                  Previously seen
 Heart:                 Appears normal         Upper Extremities:      Previously seen
                        (4CH, axis, and
                        situs)
 RVOT:                  Appears normal         Lower Extremities:      Previously seen

 Other:  Technically difficult due to maternal habitus and fetal position.
Cervix Uterus Adnexa

 Cervix
 Not visualized (advanced GA >31wks)

 Uterus
 No abnormality visualized.

 Right Ovary
 Within normal limits.
 Left Ovary
 Not visualized.

 Cul De Sac
 No free fluid seen.

 Adnexa
 No abnormality visualized.
Impression

 Follow up growth due to elevated BMI
 Normal interval growth with measurements consistent with
 dates
 Good fetal movement and amniotic fluid volume

 Anatomy completed today.
Recommendations

 Follow up growth in 4 weeks and at 8 weeks.

## 2023-03-13 ENCOUNTER — Encounter: Payer: No Typology Code available for payment source | Admitting: Obstetrics and Gynecology

## 2023-03-22 ENCOUNTER — Encounter: Payer: No Typology Code available for payment source | Admitting: Obstetrics

## 2023-03-26 ENCOUNTER — Ambulatory Visit: Payer: Medicaid Other | Attending: Obstetrics and Gynecology

## 2023-03-26 ENCOUNTER — Encounter: Payer: Self-pay | Admitting: *Deleted

## 2023-03-26 ENCOUNTER — Ambulatory Visit: Payer: Medicaid Other | Admitting: *Deleted

## 2023-03-26 ENCOUNTER — Encounter: Payer: No Typology Code available for payment source | Admitting: Obstetrics and Gynecology

## 2023-03-26 ENCOUNTER — Other Ambulatory Visit: Payer: Self-pay | Admitting: *Deleted

## 2023-03-26 VITALS — BP 122/74 | HR 92

## 2023-03-26 DIAGNOSIS — Z8679 Personal history of other diseases of the circulatory system: Secondary | ICD-10-CM | POA: Insufficient documentation

## 2023-03-26 DIAGNOSIS — O285 Abnormal chromosomal and genetic finding on antenatal screening of mother: Secondary | ICD-10-CM

## 2023-03-26 DIAGNOSIS — O09899 Supervision of other high risk pregnancies, unspecified trimester: Secondary | ICD-10-CM | POA: Insufficient documentation

## 2023-03-26 DIAGNOSIS — Z3A26 26 weeks gestation of pregnancy: Secondary | ICD-10-CM

## 2023-03-26 DIAGNOSIS — O0990 Supervision of high risk pregnancy, unspecified, unspecified trimester: Secondary | ICD-10-CM | POA: Insufficient documentation

## 2023-03-26 DIAGNOSIS — O99212 Obesity complicating pregnancy, second trimester: Secondary | ICD-10-CM | POA: Diagnosis not present

## 2023-03-26 DIAGNOSIS — O09522 Supervision of elderly multigravida, second trimester: Secondary | ICD-10-CM | POA: Diagnosis not present

## 2023-03-26 DIAGNOSIS — Z8759 Personal history of other complications of pregnancy, childbirth and the puerperium: Secondary | ICD-10-CM | POA: Diagnosis not present

## 2023-03-26 DIAGNOSIS — O09212 Supervision of pregnancy with history of pre-term labor, second trimester: Secondary | ICD-10-CM | POA: Diagnosis not present

## 2023-03-26 DIAGNOSIS — E669 Obesity, unspecified: Secondary | ICD-10-CM

## 2023-03-26 DIAGNOSIS — D563 Thalassemia minor: Secondary | ICD-10-CM

## 2023-03-26 DIAGNOSIS — O09523 Supervision of elderly multigravida, third trimester: Secondary | ICD-10-CM

## 2023-03-26 DIAGNOSIS — O99213 Obesity complicating pregnancy, third trimester: Secondary | ICD-10-CM

## 2023-03-27 ENCOUNTER — Encounter: Payer: No Typology Code available for payment source | Admitting: Obstetrics & Gynecology

## 2023-04-03 ENCOUNTER — Encounter: Payer: No Typology Code available for payment source | Admitting: Obstetrics and Gynecology

## 2023-04-16 ENCOUNTER — Encounter: Payer: No Typology Code available for payment source | Admitting: Obstetrics

## 2023-05-07 ENCOUNTER — Ambulatory Visit: Payer: Medicaid Other

## 2023-05-07 ENCOUNTER — Ambulatory Visit: Payer: Medicaid Other | Attending: Obstetrics and Gynecology

## 2023-05-18 ENCOUNTER — Encounter: Payer: No Typology Code available for payment source | Admitting: Obstetrics and Gynecology

## 2023-05-21 ENCOUNTER — Ambulatory Visit: Payer: Medicaid Other

## 2023-05-21 ENCOUNTER — Ambulatory Visit: Payer: Medicaid Other | Attending: Obstetrics and Gynecology

## 2023-05-23 ENCOUNTER — Ambulatory Visit (INDEPENDENT_AMBULATORY_CARE_PROVIDER_SITE_OTHER): Payer: 59 | Admitting: Obstetrics and Gynecology

## 2023-05-23 ENCOUNTER — Other Ambulatory Visit (HOSPITAL_COMMUNITY)
Admission: RE | Admit: 2023-05-23 | Discharge: 2023-05-23 | Disposition: A | Discharge status: Home / Self Care | Payer: 59 | Source: Ambulatory Visit | Attending: Obstetrics and Gynecology | Admitting: Obstetrics and Gynecology

## 2023-05-23 VITALS — BP 133/81 | HR 92 | Wt 279.0 lb

## 2023-05-23 DIAGNOSIS — Z23 Encounter for immunization: Secondary | ICD-10-CM

## 2023-05-23 DIAGNOSIS — O0990 Supervision of high risk pregnancy, unspecified, unspecified trimester: Secondary | ICD-10-CM | POA: Insufficient documentation

## 2023-05-23 DIAGNOSIS — O09893 Supervision of other high risk pregnancies, third trimester: Secondary | ICD-10-CM | POA: Diagnosis not present

## 2023-05-23 DIAGNOSIS — O0933 Supervision of pregnancy with insufficient antenatal care, third trimester: Secondary | ICD-10-CM

## 2023-05-23 DIAGNOSIS — O09899 Supervision of other high risk pregnancies, unspecified trimester: Secondary | ICD-10-CM

## 2023-05-23 DIAGNOSIS — O0993 Supervision of high risk pregnancy, unspecified, third trimester: Secondary | ICD-10-CM

## 2023-05-23 DIAGNOSIS — Z6841 Body Mass Index (BMI) 40.0 and over, adult: Secondary | ICD-10-CM

## 2023-05-23 DIAGNOSIS — Z3A35 35 weeks gestation of pregnancy: Secondary | ICD-10-CM | POA: Diagnosis not present

## 2023-05-23 DIAGNOSIS — Z302 Encounter for sterilization: Secondary | ICD-10-CM

## 2023-05-23 NOTE — Progress Notes (Signed)
   PRENATAL VISIT NOTE  Subjective:  Molly Sims is a 37 y.o. [id number] at [redacted]w[redacted]d being seen today for ongoing prenatal care.  She is currently monitored for the following issues for this high-risk pregnancy and has Obesity in pregnancy; BMI 50.0-59.9, adult (HCC); History of premature delivery, currently pregnant; Short interval between pregnancies affecting pregnancy, antepartum; Supervision of high risk pregnancy, antepartum; Condyloma acuminata of vulva during pregnancy; and Limited prenatal care in third trimester on their problem list.  Patient reports no complaints.  Contractions: Not present. Vag. Bleeding: None.  Movement: Present. Denies leaking of fluid.   The following portions of the patient's history were reviewed and updated as appropriate: allergies, current medications, past family history, past medical history, past social history, past surgical history and problem list.   Objective:   Vitals:   05/23/23 1034  BP: 133/81  Pulse: 92  Weight: 279 lb (126.6 kg)    Fetal Status: Fetal Heart Rate (bpm): 145   Movement: Present     General:  Alert, oriented and cooperative. Patient is in no acute distress.  Skin: Skin is warm and dry. No rash noted.   Cardiovascular: Normal heart rate noted  Respiratory: Normal respiratory effort, no problems with respiration noted  Abdomen: Soft, gravid, appropriate for gestational age.  Pain/Pressure: Absent      Assessment and Plan:  Pregnancy: [id number] at [redacted]w[redacted]d 1. Supervision of high risk pregnancy, antepartum 2. Limited prenatal care in third trimester -Pt got a new job and couldn't get time off work to come to appointments. We discussed importance of follow up appointments for remainder of pregnancy -Third trimester labs today; pt is not fasting so will schedule 2h GTT for next week. A1c added - HIV antibody (with reflex) - RPR - HgB A1c - CBC - Culture, beta strep (group b only) - Cervicovaginal ancillary only( [facility]) - Tdap vaccine greater than or equal to 7yo IM  3. BMI 50.0-59.9, adult (HCC) Planned for serial growths and weekly BPPs, pt has not been able to attend since 26 weeks Next [place] scheduled 7/15, emphasized importance of attending to assess fetal growth   4. Request for sterilization Tubal papers signed I did not specifically counsel pt on tubal today due to time constraints. Will discuss at next appt. Will likely need interval tubal due to habitus  5. History of premature delivery, currently pregnant  Please refer to After Visit Summary for other counseling recommendations.   Return in about 1 week (around 05/30/2023) for return OB at 36 weeks with 2h GTT.  Future Appointments  Date Time Provider Department Center  05/28/2023  3:00 PM [facility] NURSE [facility]  05/28/2023  3:15 PM [facility] NST [facility]  05/30/2023  8:00 AM [facility] LAB [facility] None  05/30/2023 10:15 AM Mercado-Ortiz, Lahoma Crocker, DO [facility] None  06/04/2023  7:45 AM [facility] NURSE [facility]  06/04/2023  8:00 AM [facility] US1 [facility]    Lennart Pall, MD

## 2023-05-23 NOTE — Patient Instructions (Signed)
Please do not eat or drink anything besides water before your next appointment

## 2023-05-24 LAB — HIV ANTIBODY (ROUTINE TESTING W REFLEX): HIV Screen 4th Generation wRfx: NONREACTIVE

## 2023-05-24 LAB — CERVICOVAGINAL ANCILLARY ONLY
Chlamydia: NEGATIVE
Comment: NEGATIVE
Comment: NORMAL
Neisseria Gonorrhea: NEGATIVE

## 2023-05-24 LAB — HEMOGLOBIN A1C
Est. average glucose Bld gHb Est-mCnc: 114 mg/dL
Hgb A1c MFr Bld: 5.6 % (ref 4.8–5.6)

## 2023-05-24 LAB — CBC
Hematocrit: 33.5 % — ABNORMAL LOW (ref 34.0–46.6)
Hemoglobin: 10.4 g/dL — ABNORMAL LOW (ref 11.1–15.9)
MCH: 28.2 pg (ref 26.6–33.0)
MCHC: 31 g/dL — ABNORMAL LOW (ref 31.5–35.7)
MCV: 91 fL (ref 79–97)
Platelets: 321 10*3/uL (ref 150–450)
RBC: 3.69 x10E6/uL — ABNORMAL LOW (ref 3.77–5.28)
RDW: 14.4 % (ref 11.7–15.4)
WBC: 9.3 10*3/uL (ref 3.4–10.8)

## 2023-05-24 LAB — RPR: RPR Ser Ql: NONREACTIVE

## 2023-05-25 ENCOUNTER — Encounter: Payer: Self-pay | Admitting: Obstetrics and Gynecology

## 2023-05-25 DIAGNOSIS — D649 Anemia, unspecified: Secondary | ICD-10-CM | POA: Insufficient documentation

## 2023-05-25 MED ORDER — FERROUS SULFATE 325 (65 FE) MG PO TABS: 325.0000 mg | ORAL_TABLET | ORAL | 1 refills | Status: DC

## 2023-05-25 NOTE — Addendum Note (Signed)
Addended by: Harvie Bridge on: 05/25/2023 09:55 PM   Modules accepted: Orders

## 2023-05-27 LAB — CULTURE, BETA STREP (GROUP B ONLY): Strep Gp B Culture: NEGATIVE

## 2023-05-28 ENCOUNTER — Ambulatory Visit: Payer: Medicaid Other

## 2023-05-28 ENCOUNTER — Ambulatory Visit: Payer: Medicaid Other | Attending: Maternal & Fetal Medicine

## 2023-05-30 ENCOUNTER — Encounter: Payer: Medicaid Other | Admitting: Family Medicine

## 2023-05-30 ENCOUNTER — Other Ambulatory Visit: Payer: Medicaid Other

## 2023-05-31 ENCOUNTER — Other Ambulatory Visit: Payer: Medicaid Other

## 2023-05-31 ENCOUNTER — Encounter: Payer: Medicaid Other | Admitting: Obstetrics & Gynecology

## 2023-06-04 ENCOUNTER — Ambulatory Visit: Payer: Medicaid Other

## 2023-06-04 ENCOUNTER — Ambulatory Visit: Payer: Medicaid Other | Attending: Obstetrics and Gynecology

## 2023-06-13 ENCOUNTER — Inpatient Hospital Stay (HOSPITAL_COMMUNITY)
Admission: AD | Admit: 2023-06-13 | Discharge: 2023-06-17 | DRG: 788 | Disposition: A | Discharge status: Home / Self Care | Payer: 59 | Attending: Obstetrics and Gynecology | Admitting: Obstetrics and Gynecology

## 2023-06-13 ENCOUNTER — Inpatient Hospital Stay (HOSPITAL_COMMUNITY): Payer: 59 | Admitting: Anesthesiology

## 2023-06-13 ENCOUNTER — Other Ambulatory Visit: Payer: Self-pay | Admitting: *Deleted

## 2023-06-13 ENCOUNTER — Ambulatory Visit (HOSPITAL_BASED_OUTPATIENT_CLINIC_OR_DEPARTMENT_OTHER): Payer: 59

## 2023-06-13 ENCOUNTER — Ambulatory Visit: Payer: 59 | Admitting: *Deleted

## 2023-06-13 ENCOUNTER — Encounter (HOSPITAL_COMMUNITY): Payer: Self-pay | Admitting: Obstetrics and Gynecology

## 2023-06-13 VITALS — BP 135/95 | HR 113

## 2023-06-13 DIAGNOSIS — O99213 Obesity complicating pregnancy, third trimester: Secondary | ICD-10-CM | POA: Insufficient documentation

## 2023-06-13 DIAGNOSIS — O09899 Supervision of other high risk pregnancies, unspecified trimester: Secondary | ICD-10-CM

## 2023-06-13 DIAGNOSIS — O09293 Supervision of pregnancy with other poor reproductive or obstetric history, third trimester: Secondary | ICD-10-CM

## 2023-06-13 DIAGNOSIS — Z6841 Body Mass Index (BMI) 40.0 and over, adult: Secondary | ICD-10-CM

## 2023-06-13 DIAGNOSIS — Z148 Genetic carrier of other disease: Secondary | ICD-10-CM | POA: Insufficient documentation

## 2023-06-13 DIAGNOSIS — Z3A38 38 weeks gestation of pregnancy: Secondary | ICD-10-CM

## 2023-06-13 DIAGNOSIS — O99214 Obesity complicating childbirth: Secondary | ICD-10-CM | POA: Diagnosis present

## 2023-06-13 DIAGNOSIS — O09523 Supervision of elderly multigravida, third trimester: Secondary | ICD-10-CM

## 2023-06-13 DIAGNOSIS — E669 Obesity, unspecified: Secondary | ICD-10-CM

## 2023-06-13 DIAGNOSIS — O9902 Anemia complicating childbirth: Secondary | ICD-10-CM | POA: Diagnosis not present

## 2023-06-13 DIAGNOSIS — O09213 Supervision of pregnancy with history of pre-term labor, third trimester: Secondary | ICD-10-CM | POA: Insufficient documentation

## 2023-06-13 DIAGNOSIS — O99210 Obesity complicating pregnancy, unspecified trimester: Secondary | ICD-10-CM | POA: Diagnosis present

## 2023-06-13 DIAGNOSIS — O1405 Mild to moderate pre-eclampsia, complicating the puerperium: Secondary | ICD-10-CM | POA: Diagnosis not present

## 2023-06-13 DIAGNOSIS — O1495 Unspecified pre-eclampsia, complicating the puerperium: Secondary | ICD-10-CM | POA: Diagnosis not present

## 2023-06-13 DIAGNOSIS — O0990 Supervision of high risk pregnancy, unspecified, unspecified trimester: Secondary | ICD-10-CM

## 2023-06-13 DIAGNOSIS — Z30430 Encounter for insertion of intrauterine contraceptive device: Secondary | ICD-10-CM

## 2023-06-13 DIAGNOSIS — O09893 Supervision of other high risk pregnancies, third trimester: Secondary | ICD-10-CM | POA: Insufficient documentation

## 2023-06-13 DIAGNOSIS — D563 Thalassemia minor: Secondary | ICD-10-CM | POA: Diagnosis not present

## 2023-06-13 DIAGNOSIS — O0933 Supervision of pregnancy with insufficient antenatal care, third trimester: Secondary | ICD-10-CM | POA: Diagnosis not present

## 2023-06-13 DIAGNOSIS — Z98891 History of uterine scar from previous surgery: Secondary | ICD-10-CM

## 2023-06-13 DIAGNOSIS — O26893 Other specified pregnancy related conditions, third trimester: Secondary | ICD-10-CM | POA: Diagnosis not present

## 2023-06-13 DIAGNOSIS — O99013 Anemia complicating pregnancy, third trimester: Secondary | ICD-10-CM

## 2023-06-13 DIAGNOSIS — Z8759 Personal history of other complications of pregnancy, childbirth and the puerperium: Secondary | ICD-10-CM | POA: Diagnosis not present

## 2023-06-13 LAB — CBC
HCT: 30.8 % — ABNORMAL LOW (ref 36.0–46.0)
Hemoglobin: 10.1 g/dL — ABNORMAL LOW (ref 12.0–15.0)
MCH: 29 pg (ref 26.0–34.0)
MCHC: 32.8 g/dL (ref 30.0–36.0)
MCV: 88.5 fL (ref 80.0–100.0)
Platelets: 309 10*3/uL (ref 150–400)
RBC: 3.48 MIL/uL — ABNORMAL LOW (ref 3.87–5.11)
RDW: 14.8 % (ref 11.5–15.5)
WBC: 14.5 10*3/uL — ABNORMAL HIGH (ref 4.0–10.5)
nRBC: 0 % (ref 0.0–0.2)

## 2023-06-13 LAB — TYPE AND SCREEN
ABO/RH(D): O POS
Antibody Screen: NEGATIVE

## 2023-06-13 MED ORDER — OXYTOCIN BOLUS FROM INFUSION: 333.0000 mL | Freq: Once | INTRAVENOUS | Status: DC

## 2023-06-13 MED ORDER — FENTANYL-BUPIVACAINE-NACL 0.5-0.125-0.9 MG/250ML-% EP SOLN
EPIDURAL | Status: DC | PRN
  Administered 2023-06-13: 12 mL/h via EPIDURAL

## 2023-06-13 MED ORDER — LIDOCAINE HCL (PF) 1 % IJ SOLN
INTRAMUSCULAR | Status: DC | PRN
  Administered 2023-06-13: 10 mL via EPIDURAL
  Administered 2023-06-13: 2 mL via EPIDURAL

## 2023-06-13 MED ORDER — LIDOCAINE HCL (PF) 1 % IJ SOLN: 30.0000 mL | INTRAMUSCULAR | Status: DC | PRN

## 2023-06-13 MED ORDER — OXYCODONE-ACETAMINOPHEN 5-325 MG PO TABS: 1.0000 | ORAL_TABLET | ORAL | Status: DC | PRN

## 2023-06-13 MED ORDER — ONDANSETRON HCL 4 MG/2ML IJ SOLN: 4.0000 mg | Freq: Four times a day (QID) | INTRAMUSCULAR | Status: DC | PRN

## 2023-06-13 MED ORDER — OXYTOCIN-SODIUM CHLORIDE 30-0.9 UT/500ML-% IV SOLN: 1.0000 m[IU]/min | INTRAVENOUS | Status: DC

## 2023-06-13 MED ORDER — LACTATED RINGERS IV SOLN: 500.0000 mL | INTRAVENOUS | Status: DC | PRN

## 2023-06-13 MED ORDER — FENTANYL-BUPIVACAINE-NACL 0.5-0.125-0.9 MG/250ML-% EP SOLN
12.0000 mL/h | EPIDURAL | Status: DC | PRN
  Filled 2023-06-13: qty 250

## 2023-06-13 MED ORDER — OXYCODONE-ACETAMINOPHEN 5-325 MG PO TABS: 2.0000 | ORAL_TABLET | ORAL | Status: DC | PRN

## 2023-06-13 MED ORDER — DIPHENHYDRAMINE HCL 50 MG/ML IJ SOLN: 12.5000 mg | INTRAMUSCULAR | Status: DC | PRN

## 2023-06-13 MED ORDER — OXYTOCIN-SODIUM CHLORIDE 30-0.9 UT/500ML-% IV SOLN: 2.5000 [IU]/h | INTRAVENOUS | Status: DC

## 2023-06-13 MED ORDER — EPHEDRINE 5 MG/ML INJ: 10.0000 mg | INTRAVENOUS | Status: DC | PRN

## 2023-06-13 MED ORDER — TERBUTALINE SULFATE 1 MG/ML IJ SOLN: 0.2500 mg | Freq: Once | INTRAMUSCULAR | Status: DC | PRN

## 2023-06-13 MED ORDER — PHENYLEPHRINE 80 MCG/ML (10ML) SYRINGE FOR IV PUSH (FOR BLOOD PRESSURE SUPPORT): 80.0000 ug | PREFILLED_SYRINGE | INTRAVENOUS | Status: DC | PRN

## 2023-06-13 MED ORDER — SOD CITRATE-CITRIC ACID 500-334 MG/5ML PO SOLN
30.0000 mL | ORAL | Status: DC | PRN
  Filled 2023-06-13: qty 30

## 2023-06-13 MED ORDER — FENTANYL CITRATE (PF) 100 MCG/2ML IJ SOLN
50.0000 ug | INTRAMUSCULAR | Status: DC | PRN
  Filled 2023-06-13: qty 2

## 2023-06-13 MED ORDER — LACTATED RINGERS IV SOLN: INTRAVENOUS | Status: DC

## 2023-06-13 MED ORDER — OXYTOCIN-SODIUM CHLORIDE 30-0.9 UT/500ML-% IV SOLN
1.0000 m[IU]/min | INTRAVENOUS | Status: DC
  Administered 2023-06-14: 1 m[IU]/min via INTRAVENOUS
  Filled 2023-06-13: qty 500

## 2023-06-13 MED ORDER — ACETAMINOPHEN 325 MG PO TABS: 650.0000 mg | ORAL_TABLET | ORAL | Status: DC | PRN

## 2023-06-13 MED ORDER — LACTATED RINGERS IV SOLN
500.0000 mL | Freq: Once | INTRAVENOUS | Status: AC
  Administered 2023-06-13: 500 mL via INTRAVENOUS

## 2023-06-13 NOTE — Anesthesia Preprocedure Evaluation (Signed)
Anesthesia Evaluation  Patient identified by MRN, date of birth, ID band Patient awake    Reviewed: Allergy & Precautions, Patient's Chart, lab work & pertinent test results  Airway Mallampati: III  TM Distance: >3 FB Neck ROM: Full    Dental no notable dental hx.    Pulmonary neg pulmonary ROS   Pulmonary exam normal breath sounds clear to auscultation       Cardiovascular hypertension, Normal cardiovascular exam Rhythm:Regular Rate:Normal     Neuro/Psych negative neurological ROS  negative psych ROS   GI/Hepatic negative GI ROS, Neg liver ROS,,,  Endo/Other    Morbid obesityBMI 53  Renal/GU negative Renal ROS  negative genitourinary   Musculoskeletal negative musculoskeletal ROS (+)    Abdominal  (+) + obese  Peds negative pediatric ROS (+)  Hematology  (+) Blood dyscrasia, anemia Hb 10.1., plt 309   Anesthesia Other Findings   Reproductive/Obstetrics (+) Pregnancy                             Anesthesia Physical Anesthesia Plan  ASA: 3  Anesthesia Plan: Epidural   Post-op Pain Management:    Induction:   PONV Risk Score and Plan: 2  Airway Management Planned: Natural Airway  Additional Equipment: None  Intra-op Plan:   Post-operative Plan:   Informed Consent: I have reviewed the patients History and Physical, chart, labs and discussed the procedure including the risks, benefits and alternatives for the proposed anesthesia with the patient or authorized representative who has indicated his/her understanding and acceptance.       Plan Discussed with:   Anesthesia Plan Comments:        Anesthesia Quick Evaluation

## 2023-06-13 NOTE — H&P (Signed)
OBSTETRIC ADMISSION HISTORY AND PHYSICAL  Molly Sims is a 37 y.o. female [phone] with IUP at [redacted]w[redacted]d by LMP presenting for SOL. She reports +FMs, No LOF, no VB, no blurry vision, headaches or peripheral edema, and RUQ pain.  She plans on formula feeding. She request BTL for birth control. She received her prenatal care at  [facility]     Dating: By LMP --->  Estimated Date of Delivery: 06/26/23  Sono:    @[redacted]w[redacted]d , CWD, normal anatomy, cephalic presentation, anterior placenta, 2930 g, 21% EFW   Prenatal History/Complications: Limited prenatal care   Past Medical History: Past Medical History:  Diagnosis Date   Anemia    Hypertension    in 3rd pregnancy    Past Surgical History: Past Surgical History:  Procedure Laterality Date   WISDOM TOOTH EXTRACTION      Obstetrical History: OB History     Gravida  4   Para  3   Term  1   Preterm  2   AB      Living  3      SAB      IAB      Ectopic      Multiple  0   Live Births  3           Social History Social History   Socioeconomic History   Marital status: Single    Spouse name: Not on file   Number of children: Not on file   Years of education: Not on file   Highest education level: Not on file  Occupational History   Not on file  Tobacco Use   Smoking status: Never   Smokeless tobacco: Never  Vaping Use   Vaping status: Never Used  Substance and Sexual Activity   Alcohol use: Not Currently    Comment: Not since confirmed pregnancy   Drug use: Not Currently    Types: Marijuana    Comment: none since pregnancy was confirmed   Sexual activity: Yes    Partners: Male    Birth control/protection: None  Other Topics Concern   Not on file  Social History Narrative   Not on file   Social Determinants of Health   Financial Resource Strain: Not on file  Food Insecurity: No Food Insecurity (06/13/2023)   Hunger Vital Sign    Worried About Running Out of Food in the Last Year: Never true    Ran  Out of Food in the Last Year: Never true  Transportation Needs: No Transportation Needs (06/13/2023)   PRAPARE - Administrator, Civil Service (Medical): No    Lack of Transportation (Non-Medical): No  Physical Activity: Not on file  Stress: Not on file  Social Connections: Unknown (03/14/2022)   Received from [facility]   Social Network    Social Network: Not on file    Family History: Family History  Problem Relation Age of Onset   Hypertension Paternal Grandfather    Diabetes Paternal Grandmother    Hypertension Paternal Grandmother    Stroke Maternal Grandmother    Hypertension Maternal Grandmother    Hypertension Maternal Grandfather    Hypertension Mother     Allergies: No Known Allergies  Medications Prior to Admission  Medication Sig Dispense Refill Last Dose   aspirin EC 81 MG tablet Take 1 tablet (81 mg total) by mouth daily. Take after 12 weeks for prevention of preeclampsia later in pregnancy 300 tablet 2 06/12/2023   Prenatal Vit-Fe Fumarate-FA (PRENATAL  PLUS VITAMIN/MINERAL) 27-1 MG TABS Take 1 tablet by mouth daily. 30 tablet 11 06/13/2023   Blood Pressure Monitoring (BLOOD PRESSURE KIT) DEVI 1 kit by Does not apply route once a week. 1 each 0    ferrous sulfate 325 (65 FE) MG tablet Take 1 tablet (325 mg total) by mouth every other day. (Patient not taking: Reported on 06/13/2023) 90 tablet 1    imiquimod (ALDARA) 5 % cream Apply topically 3 (three) times a week. Apply until total clearance or maximum of 16 weeks (Patient not taking: Reported on 06/13/2023) 24 each 5      Review of Systems   All systems reviewed and negative except as stated in HPI  Blood pressure 137/78, pulse (!) 112, temperature 98.7 F (37.1 C), temperature source Oral, resp. rate 16, height 5\' 1"  (1.549 m), weight 126.8 kg, last menstrual period 09/27/2022, SpO2 100%, unknown if currently breastfeeding. General appearance: alert, cooperative, and appears stated age Lungs:  Normal work of breathing Heart: regular rate  Abdomen: Gravid Extremities: Homans sign is negative, no sign of DVT Fetal monitoringBaseline: 145 bpm, Variability: Good {> 6 bpm), Accelerations: Reactive, and Decelerations: Absent Uterine activity every 5 to 7 minutes Dilation: 6 Effacement (%): 50 Station: -3 Exam by:: Imagene Sheller, RN   Prenatal labs: ABO, Rh: O/Positive/-- (02/29 1020) Antibody: Negative (02/29 1020) Rubella: 1.16 (02/29 1020) RPR: Non Reactive (07/10 1139)  HBsAg: Negative (02/29 1020)  HIV: Non Reactive (07/10 1139)  GBS: Negative/-- (07/10 1144)  1 hr Glucola Not done  Genetic screening  LR Anatomy US Normal  Prenatal Transfer Tool  Maternal Diabetes: No Genetic Screening: Normal Maternal Ultrasounds/Referrals: Normal Fetal Ultrasounds or other Referrals:  None Maternal Substance Abuse:  No Significant Maternal Medications:  None Significant Maternal Lab Results:  Group B Strep negative Number of Prenatal Visits:Less than or equal to 3 verified prenatal visits Other Comments:  None  No results found for this or any previous visit (from the past 24 hour(s)).  Patient Active Problem List   Diagnosis Date Noted   Normal labor 06/13/2023   Anemia 05/25/2023   Limited prenatal care in third trimester 05/23/2023   Condyloma acuminata of vulva during pregnancy 01/16/2023   Supervision of high risk pregnancy, antepartum 01/11/2023   BMI 50.0-59.9, adult (HCC) 03/14/2022   History of premature delivery, currently pregnant 03/14/2022   Short interval between pregnancies affecting pregnancy, antepartum 03/14/2022   Obesity in pregnancy 08/24/2021    Assessment/Plan:  Molly Sims is a 37 y.o. [id number] at [redacted]w[redacted]d here for SOL  #Labor:Progressing spontaneously #Pain: Per patient request  #FWB: Cat 1 #ID:  GBS neg #MOF: Bottle #MOC:Interval BTL #Circ:  Yes  Celedonio Savage, MD  06/13/2023, 4:17 PM

## 2023-06-13 NOTE — MAU Note (Signed)
.  Molly Sims is a 37 y.o. at [redacted]w[redacted]d here in MAU reporting: CTX's that began three hours ago that are currently 5 minutes apart. Denies LOF but reports mucous like discharge with smears of red blood. +FM.  Came over after BPP. Results not yet visible in epic. Patient reports 8/8.  Onset of complaint: 3 hours ago Pain score: 6/10 lower abdomen  FHT: 162 initial external Lab orders laced from triage: MAU Labor Eval

## 2023-06-13 NOTE — Progress Notes (Signed)
Labor Progress Note Molly Sims is a 37 y.o. O1H0865 at [redacted]w[redacted]d presented for spontaneous onset of labor. S: doing well. Minimal cervical change since last check. Contractions noted to have spaced out.  O:  BP (!) 144/76   Pulse (!) 105   Temp 98.4 F (36.9 C) (Oral)   Resp 16   Ht 5\' 1"  (1.549 m)   Wt 126.8 kg   LMP 09/27/2022   SpO2 99%   BMI 52.81 kg/m  EFM: 140/moderate/+accels, intermittent late and variable decels  CVE: Dilation: 7.5 Effacement (%): 60 Cervical Position: Anterior Station: -3 Presentation: Vertex Exam by:: Dr. Ladon Applebaum   [name]: 37 y.o. [id number] [redacted]w[redacted]d admitted with spontaneous onset of labor. #Labor: slow progress in the last few hours. Will benefit from pitocin augmentation. IVF LR bolus given, along with position changes due to late decels. Will allow for baby to recover and then start pitocin 1X1 for augmentation #Pain: epidural in place and comfortable #FWB: Cat 1 #GBS negative  [name] MD MPH OB Fellow, Faculty Practice [facility], Center for [facility] 06/13/2023

## 2023-06-13 NOTE — Progress Notes (Signed)
LABOR PROGRESS NOTE  Molly Sims is a 37 y.o. [id number] at [redacted]w[redacted]d  admitted for SOL  Subjective: Currently doing well.  Not having frequent contractions  Objective: BP 137/78   Pulse (!) 111   Temp 98.1 F (36.7 C) (Oral)   Resp 16   Ht 5\' 1"  (1.549 m)   Wt 126.8 kg   LMP 09/27/2022   SpO2 100%   BMI 52.81 kg/m  or  Vitals:   06/13/23 1529 06/13/23 1609  BP: 137/78 137/78  Pulse: (!) 112 (!) 111  Resp: 16 16  Temp: 98.7 F (37.1 C) 98.1 F (36.7 C)  TempSrc: Oral Oral  SpO2: 100%   Weight: 126.8 kg   Height: 5\' 1"  (1.549 m)     Dilation: 7 Effacement (%): 50 Station: -3 Presentation: Vertex Exam by:: Dr. Nobie Putnam FHT: baseline rate 145, moderate varibility, +acel, no decel Toco: Every 6 to 8 minutes  Labs: Lab Results  Component Value Date   WBC 14.5 (H) 06/13/2023   HGB 10.1 (L) 06/13/2023   HCT 30.8 (L) 06/13/2023   MCV 88.5 06/13/2023   PLT 309 06/13/2023    Patient Active Problem List   Diagnosis Date Noted   Normal labor 06/13/2023   Anemia 05/25/2023   Limited prenatal care in third trimester 05/23/2023   Condyloma acuminata of vulva during pregnancy 01/16/2023   Supervision of high risk pregnancy, antepartum 01/11/2023   BMI 50.0-59.9, adult (HCC) 03/14/2022   History of premature delivery, currently pregnant 03/14/2022   Short interval between pregnancies affecting pregnancy, antepartum 03/14/2022   Obesity in pregnancy 08/24/2021    Assessment / Plan: 37 y.o. [id number] at [redacted]w[redacted]d here for SOL  Labor: Minimal dilation from previous check but bulging bag at introitus.  She is progressing so we will plan on AROM when patient comfortable with epidural Fetal Wellbeing: Category 1 Pain Control: Epidural requested Anticipated MOD: Vaginal delivery  Derrel Nip, MD  OB Fellow  06/13/2023, 6:04 PM

## 2023-06-13 NOTE — Anesthesia Procedure Notes (Signed)
Epidural Patient location during procedure: OB Start time: 06/13/2023 6:28 PM End time: 06/13/2023 6:39 PM  Staffing Anesthesiologist: Lannie Fields, DO Performed: anesthesiologist   Preanesthetic Checklist Completed: patient identified, IV checked, risks and benefits discussed, monitors and equipment checked, pre-op evaluation and timeout performed  Epidural Patient position: sitting Prep: DuraPrep and site prepped and draped Patient monitoring: continuous pulse ox, blood pressure, heart rate and cardiac monitor Approach: midline Location: L3-L4 Injection technique: LOR air  Needle:  Needle type: Tuohy  Needle gauge: 17 G Needle length: 9 cm Needle insertion depth: 8 cm Catheter type: closed end flexible Catheter size: 19 Gauge Catheter at skin depth: 12 cm Test dose: negative  Assessment Sensory level: T8 Events: blood not aspirated, no cerebrospinal fluid, injection not painful, no injection resistance, no paresthesia and negative IV test  Additional Notes Patient identified. Risks/Benefits/Options discussed with patient including but not limited to bleeding, infection, nerve damage, paralysis, failed block, incomplete pain control, headache, blood pressure changes, nausea, vomiting, reactions to medication both or allergic, itching and postpartum back pain. Confirmed with bedside nurse the patient's most recent platelet count. Confirmed with patient that they are not currently taking any anticoagulation, have any bleeding history or any family history of bleeding disorders. Patient expressed understanding and wished to proceed. All questions were answered. Sterile technique was used throughout the entire procedure. Please see nursing notes for vital signs. Test dose was given through epidural catheter and negative prior to continuing to dose epidural or start infusion. Warning signs of high block given to the patient including shortness of breath, tingling/numbness in  hands, complete motor block, or any concerning symptoms with instructions to call for help. Patient was given instructions on fall risk and not to get out of bed. All questions and concerns addressed with instructions to call with any issues or inadequate analgesia.  Reason for block:procedure for pain

## 2023-06-13 NOTE — Progress Notes (Signed)
LABOR PROGRESS NOTE  Molly Sims is a 37 y.o. [phone] at [redacted]w[redacted]d  admitted for SOL  Subjective: Comfortable with epidural.  Objective: BP 131/83   Pulse (!) 105   Temp 98.4 F (36.9 C) (Oral)   Resp 16   Ht 5\' 1"  (1.549 m)   Wt 126.8 kg   LMP 09/27/2022   SpO2 99%   BMI 52.81 kg/m  or  Vitals:   06/13/23 1852 06/13/23 1856 06/13/23 1900 06/13/23 1904  BP: (!) 124/54 128/73  131/83  Pulse: (!) 121 99  (!) 105  Resp:   16   Temp:   98.4 F (36.9 C)   TempSrc:   Oral   SpO2:  99%    Weight:      Height:        Dilation: 7 Effacement (%): 50 Station: -1 Presentation: Vertex Exam by:: Dr. Nobie Putnam FHT: baseline rate 145, moderate varibility, +acel, no decel Toco: Every 6 to 8 minutes  Labs: Lab Results  Component Value Date   WBC 14.5 (H) 06/13/2023   HGB 10.1 (L) 06/13/2023   HCT 30.8 (L) 06/13/2023   MCV 88.5 06/13/2023   PLT 309 06/13/2023    Patient Active Problem List   Diagnosis Date Noted   Normal labor 06/13/2023   Anemia 05/25/2023   Limited prenatal care in third trimester 05/23/2023   Condyloma acuminata of vulva during pregnancy 01/16/2023   Supervision of high risk pregnancy, antepartum 01/11/2023   BMI 50.0-59.9, adult (HCC) 03/14/2022   History of premature delivery, currently pregnant 03/14/2022   Short interval between pregnancies affecting pregnancy, antepartum 03/14/2022   Obesity in pregnancy 08/24/2021    Assessment / Plan: 37 y.o. [id number] at [redacted]w[redacted]d here for SOL  Labor: Back still at the introitus.  Cervix 7 cm.  AROM with clear fluid.  70% effaced.  Will continue to monitor and start Pitocin if needed. Fetal Wellbeing: Category 1 Pain Control: Epidural requested Anticipated MOD: Vaginal delivery  Derrel Nip, MD  OB Fellow  06/13/2023, 7:20 PM

## 2023-06-14 ENCOUNTER — Encounter (HOSPITAL_COMMUNITY): Payer: Self-pay | Admitting: Obstetrics and Gynecology

## 2023-06-14 ENCOUNTER — Other Ambulatory Visit: Payer: Self-pay

## 2023-06-14 ENCOUNTER — Encounter (HOSPITAL_COMMUNITY)
Admission: AD | Disposition: A | Discharge status: Home / Self Care | Payer: Self-pay | Source: Home / Self Care | Attending: Obstetrics and Gynecology

## 2023-06-14 DIAGNOSIS — Z98891 History of uterine scar from previous surgery: Principal | ICD-10-CM

## 2023-06-14 DIAGNOSIS — Z3A38 38 weeks gestation of pregnancy: Secondary | ICD-10-CM

## 2023-06-14 DIAGNOSIS — O99214 Obesity complicating childbirth: Secondary | ICD-10-CM

## 2023-06-14 DIAGNOSIS — Z30430 Encounter for insertion of intrauterine contraceptive device: Secondary | ICD-10-CM

## 2023-06-14 DIAGNOSIS — O1405 Mild to moderate pre-eclampsia, complicating the puerperium: Secondary | ICD-10-CM

## 2023-06-14 DIAGNOSIS — O0933 Supervision of pregnancy with insufficient antenatal care, third trimester: Secondary | ICD-10-CM

## 2023-06-14 HISTORY — PX: INTRAUTERINE DEVICE (IUD) INSERTION: SHX5877

## 2023-06-14 SURGERY — Surgical Case
Anesthesia: Epidural

## 2023-06-14 MED ORDER — MORPHINE SULFATE (PF) 0.5 MG/ML IJ SOLN
INTRAMUSCULAR | Status: AC
  Filled 2023-06-14: qty 10

## 2023-06-14 MED ORDER — KETOROLAC TROMETHAMINE 30 MG/ML IJ SOLN
30.0000 mg | Freq: Four times a day (QID) | INTRAMUSCULAR | Status: AC | PRN
  Administered 2023-06-14: 30 mg via INTRAVENOUS

## 2023-06-14 MED ORDER — CEFAZOLIN IN SODIUM CHLORIDE 3-0.9 GM/100ML-% IV SOLN
3.0000 g | INTRAVENOUS | Status: AC
  Administered 2023-06-14: 3 g via INTRAVENOUS

## 2023-06-14 MED ORDER — LEVONORGESTREL 20 MCG/DAY IU IUD
INTRAUTERINE_SYSTEM | INTRAUTERINE | Status: AC
  Filled 2023-06-14: qty 1

## 2023-06-14 MED ORDER — ONDANSETRON HCL 4 MG/2ML IJ SOLN: 4.0000 mg | Freq: Once | INTRAMUSCULAR | Status: DC | PRN

## 2023-06-14 MED ORDER — SOD CITRATE-CITRIC ACID 500-334 MG/5ML PO SOLN
30.0000 mL | ORAL | Status: AC
  Administered 2023-06-14: 30 mL via ORAL

## 2023-06-14 MED ORDER — ACETAMINOPHEN 500 MG PO TABS: 1000.0000 mg | ORAL_TABLET | Freq: Four times a day (QID) | ORAL | Status: DC

## 2023-06-14 MED ORDER — DIPHENHYDRAMINE HCL 50 MG/ML IJ SOLN
12.5000 mg | INTRAMUSCULAR | Status: DC | PRN
  Administered 2023-06-14: 12.5 mg via INTRAVENOUS
  Filled 2023-06-14: qty 1

## 2023-06-14 MED ORDER — WITCH HAZEL-GLYCERIN EX PADS: 1.0000 | MEDICATED_PAD | CUTANEOUS | Status: DC | PRN

## 2023-06-14 MED ORDER — NALOXONE HCL 4 MG/10ML IJ SOLN: 1.0000 ug/kg/h | INTRAVENOUS | Status: DC | PRN

## 2023-06-14 MED ORDER — SIMETHICONE 80 MG PO CHEW
80.0000 mg | CHEWABLE_TABLET | Freq: Three times a day (TID) | ORAL | Status: DC
  Administered 2023-06-14 – 2023-06-17 (×9): 80 mg via ORAL
  Filled 2023-06-14 (×9): qty 1

## 2023-06-14 MED ORDER — NALOXONE HCL 0.4 MG/ML IJ SOLN: 0.4000 mg | INTRAMUSCULAR | Status: DC | PRN

## 2023-06-14 MED ORDER — OXYTOCIN-SODIUM CHLORIDE 30-0.9 UT/500ML-% IV SOLN
2.5000 [IU]/h | INTRAVENOUS | Status: AC
  Administered 2023-06-14: 2.5 [IU]/h via INTRAVENOUS

## 2023-06-14 MED ORDER — HYDROMORPHONE HCL 1 MG/ML IJ SOLN: 0.2500 mg | INTRAMUSCULAR | Status: DC | PRN

## 2023-06-14 MED ORDER — METOCLOPRAMIDE HCL 5 MG/ML IJ SOLN
INTRAMUSCULAR | Status: DC | PRN
  Administered 2023-06-14: 10 mg via INTRAVENOUS

## 2023-06-14 MED ORDER — IBUPROFEN 600 MG PO TABS
600.0000 mg | ORAL_TABLET | Freq: Four times a day (QID) | ORAL | Status: DC
  Administered 2023-06-15 – 2023-06-17 (×9): 600 mg via ORAL
  Filled 2023-06-14 (×9): qty 1

## 2023-06-14 MED ORDER — MENTHOL 3 MG MT LOZG: 1.0000 | LOZENGE | OROMUCOSAL | Status: DC | PRN

## 2023-06-14 MED ORDER — DIBUCAINE (PERIANAL) 1 % EX OINT: 1.0000 | TOPICAL_OINTMENT | CUTANEOUS | Status: DC | PRN

## 2023-06-14 MED ORDER — SODIUM CHLORIDE 0.9 % IV SOLN
500.0000 mg | INTRAVENOUS | Status: AC
  Administered 2023-06-14: 500 mg via INTRAVENOUS

## 2023-06-14 MED ORDER — LEVONORGESTREL 20 MCG/DAY IU IUD
1.0000 | INTRAUTERINE_SYSTEM | Freq: Once | INTRAUTERINE | Status: AC
  Administered 2023-06-14: 1 via INTRAUTERINE

## 2023-06-14 MED ORDER — ACETAMINOPHEN 10 MG/ML IV SOLN
1000.0000 mg | Freq: Once | INTRAVENOUS | Status: AC
  Administered 2023-06-14: 1000 mg via INTRAVENOUS

## 2023-06-14 MED ORDER — ONDANSETRON HCL 4 MG/2ML IJ SOLN
INTRAMUSCULAR | Status: AC
  Filled 2023-06-14: qty 2

## 2023-06-14 MED ORDER — AMISULPRIDE (ANTIEMETIC) 5 MG/2ML IV SOLN: 10.0000 mg | Freq: Once | INTRAVENOUS | Status: DC | PRN

## 2023-06-14 MED ORDER — ACETAMINOPHEN 325 MG PO TABS
ORAL_TABLET | ORAL | Status: AC
  Filled 2023-06-14: qty 2

## 2023-06-14 MED ORDER — DIPHENHYDRAMINE HCL 25 MG PO CAPS: 25.0000 mg | ORAL_CAPSULE | Freq: Four times a day (QID) | ORAL | Status: DC | PRN

## 2023-06-14 MED ORDER — MEPERIDINE HCL 25 MG/ML IJ SOLN: 6.2500 mg | INTRAMUSCULAR | Status: DC | PRN

## 2023-06-14 MED ORDER — OXYCODONE HCL 5 MG/5ML PO SOLN: 5.0000 mg | Freq: Once | ORAL | Status: DC | PRN

## 2023-06-14 MED ORDER — LIDOCAINE-EPINEPHRINE (PF) 2 %-1:200000 IJ SOLN
INTRAMUSCULAR | Status: DC | PRN
Start: 2023-06-14 — End: 2023-06-14
  Administered 2023-06-14: 3 mL via INTRADERMAL

## 2023-06-14 MED ORDER — ENOXAPARIN SODIUM 60 MG/0.6ML IJ SOSY
60.0000 mg | PREFILLED_SYRINGE | INTRAMUSCULAR | Status: DC
  Administered 2023-06-14 – 2023-06-16 (×3): 60 mg via SUBCUTANEOUS
  Filled 2023-06-14 (×3): qty 0.6

## 2023-06-14 MED ORDER — GABAPENTIN 100 MG PO CAPS
200.0000 mg | ORAL_CAPSULE | Freq: Every day | ORAL | Status: DC
  Administered 2023-06-14 – 2023-06-16 (×3): 200 mg via ORAL
  Filled 2023-06-14 (×3): qty 2

## 2023-06-14 MED ORDER — MECLIZINE HCL 25 MG PO TABS
25.0000 mg | ORAL_TABLET | Freq: Once | ORAL | Status: AC
  Administered 2023-06-14: 25 mg via ORAL
  Filled 2023-06-14 (×2): qty 1

## 2023-06-14 MED ORDER — SODIUM CHLORIDE 0.9 % IV SOLN: INTRAVENOUS | Status: DC | PRN

## 2023-06-14 MED ORDER — DIPHENHYDRAMINE HCL 50 MG/ML IJ SOLN
INTRAMUSCULAR | Status: DC | PRN
Start: 2023-06-14 — End: 2023-06-14
  Administered 2023-06-14: 12.5 mg via INTRAVENOUS

## 2023-06-14 MED ORDER — DIPHENHYDRAMINE HCL 50 MG/ML IJ SOLN
INTRAMUSCULAR | Status: AC
  Filled 2023-06-14: qty 1

## 2023-06-14 MED ORDER — ACETAMINOPHEN 500 MG PO TABS
1000.0000 mg | ORAL_TABLET | Freq: Four times a day (QID) | ORAL | Status: DC
  Administered 2023-06-14 – 2023-06-17 (×12): 1000 mg via ORAL
  Filled 2023-06-14 (×13): qty 2

## 2023-06-14 MED ORDER — SODIUM CHLORIDE 0.9 % IR SOLN
Status: DC | PRN
  Administered 2023-06-14: 1

## 2023-06-14 MED ORDER — KETOROLAC TROMETHAMINE 30 MG/ML IJ SOLN
INTRAMUSCULAR | Status: AC
  Filled 2023-06-14: qty 1

## 2023-06-14 MED ORDER — COCONUT OIL OIL: 1.0000 | TOPICAL_OIL | Status: DC | PRN

## 2023-06-14 MED ORDER — DEXAMETHASONE SODIUM PHOSPHATE 4 MG/ML IJ SOLN
INTRAMUSCULAR | Status: AC
  Filled 2023-06-14: qty 1

## 2023-06-14 MED ORDER — KETOROLAC TROMETHAMINE 30 MG/ML IJ SOLN
30.0000 mg | Freq: Four times a day (QID) | INTRAMUSCULAR | Status: AC
  Administered 2023-06-14 – 2023-06-15 (×4): 30 mg via INTRAVENOUS
  Filled 2023-06-14 (×4): qty 1

## 2023-06-14 MED ORDER — SIMETHICONE 80 MG PO CHEW: 80.0000 mg | CHEWABLE_TABLET | ORAL | Status: DC | PRN

## 2023-06-14 MED ORDER — KETOROLAC TROMETHAMINE 30 MG/ML IJ SOLN: 30.0000 mg | Freq: Four times a day (QID) | INTRAMUSCULAR | Status: AC | PRN

## 2023-06-14 MED ORDER — MORPHINE SULFATE (PF) 0.5 MG/ML IJ SOLN
INTRAMUSCULAR | Status: DC | PRN
  Administered 2023-06-14: 3 mg via EPIDURAL

## 2023-06-14 MED ORDER — LACTATED RINGERS AMNIOINFUSION: INTRAVENOUS | Status: DC

## 2023-06-14 MED ORDER — FENTANYL CITRATE (PF) 100 MCG/2ML IJ SOLN
INTRAMUSCULAR | Status: AC
  Filled 2023-06-14: qty 2

## 2023-06-14 MED ORDER — SODIUM CHLORIDE 0.9% FLUSH: 3.0000 mL | INTRAVENOUS | Status: DC | PRN

## 2023-06-14 MED ORDER — SCOPOLAMINE 1 MG/3DAYS TD PT72
1.0000 | MEDICATED_PATCH | Freq: Once | TRANSDERMAL | Status: AC
  Administered 2023-06-14: 1.5 mg via TRANSDERMAL

## 2023-06-14 MED ORDER — ZOLPIDEM TARTRATE 5 MG PO TABS: 5.0000 mg | ORAL_TABLET | Freq: Every evening | ORAL | Status: DC | PRN

## 2023-06-14 MED ORDER — ONDANSETRON HCL 4 MG/2ML IJ SOLN
4.0000 mg | Freq: Three times a day (TID) | INTRAMUSCULAR | Status: DC | PRN
  Administered 2023-06-14: 4 mg via INTRAVENOUS
  Filled 2023-06-14: qty 2

## 2023-06-14 MED ORDER — STERILE WATER FOR IRRIGATION IR SOLN
Status: DC | PRN
  Administered 2023-06-14: 1000 mL

## 2023-06-14 MED ORDER — PRENATAL MULTIVITAMIN CH
1.0000 | ORAL_TABLET | Freq: Every day | ORAL | Status: DC
  Administered 2023-06-14 – 2023-06-17 (×4): 1 via ORAL
  Filled 2023-06-14 (×4): qty 1

## 2023-06-14 MED ORDER — PHENYLEPHRINE HCL-NACL 20-0.9 MG/250ML-% IV SOLN
INTRAVENOUS | Status: DC | PRN
  Administered 2023-06-14: 60 ug/min via INTRAVENOUS

## 2023-06-14 MED ORDER — DEXAMETHASONE SODIUM PHOSPHATE 4 MG/ML IJ SOLN
INTRAMUSCULAR | Status: DC | PRN
  Administered 2023-06-14: 4 mg via INTRAVENOUS

## 2023-06-14 MED ORDER — ACETAMINOPHEN 10 MG/ML IV SOLN
INTRAVENOUS | Status: AC
  Filled 2023-06-14: qty 100

## 2023-06-14 MED ORDER — ONDANSETRON HCL 4 MG/2ML IJ SOLN
INTRAMUSCULAR | Status: DC | PRN
  Administered 2023-06-14: 4 mg via INTRAVENOUS

## 2023-06-14 MED ORDER — OXYCODONE HCL 5 MG PO TABS: 5.0000 mg | ORAL_TABLET | ORAL | Status: DC | PRN

## 2023-06-14 MED ORDER — SCOPOLAMINE 1 MG/3DAYS TD PT72
MEDICATED_PATCH | TRANSDERMAL | Status: AC
  Filled 2023-06-14: qty 1

## 2023-06-14 MED ORDER — FENTANYL CITRATE (PF) 100 MCG/2ML IJ SOLN
INTRAMUSCULAR | Status: DC | PRN
  Administered 2023-06-14: 100 ug via EPIDURAL

## 2023-06-14 MED ORDER — LIDOCAINE-EPINEPHRINE (PF) 2 %-1:200000 IJ SOLN
INTRAMUSCULAR | Status: DC | PRN
  Administered 2023-06-14: 10 mL via EPIDURAL

## 2023-06-14 MED ORDER — DEXMEDETOMIDINE HCL IN NACL 200 MCG/50ML IV SOLN
INTRAVENOUS | Status: DC | PRN
  Administered 2023-06-14: 8 ug via INTRAVENOUS
  Administered 2023-06-14: 12 ug via INTRAVENOUS

## 2023-06-14 MED ORDER — OXYTOCIN-SODIUM CHLORIDE 30-0.9 UT/500ML-% IV SOLN
INTRAVENOUS | Status: AC
  Filled 2023-06-14: qty 500

## 2023-06-14 MED ORDER — FENTANYL CITRATE (PF) 100 MCG/2ML IJ SOLN
INTRAMUSCULAR | Status: DC | PRN
  Administered 2023-06-14 (×2): 50 ug via INTRAVENOUS

## 2023-06-14 MED ORDER — OXYTOCIN-SODIUM CHLORIDE 30-0.9 UT/500ML-% IV SOLN
INTRAVENOUS | Status: DC | PRN
  Administered 2023-06-14: 30 [IU] via INTRAVENOUS

## 2023-06-14 MED ORDER — OXYCODONE HCL 5 MG PO TABS: 5.0000 mg | ORAL_TABLET | Freq: Once | ORAL | Status: DC | PRN

## 2023-06-14 MED ORDER — KETOROLAC TROMETHAMINE 30 MG/ML IJ SOLN: 30.0000 mg | Freq: Once | INTRAMUSCULAR | Status: DC | PRN

## 2023-06-14 MED ORDER — SENNOSIDES-DOCUSATE SODIUM 8.6-50 MG PO TABS
2.0000 | ORAL_TABLET | Freq: Every day | ORAL | Status: DC
  Administered 2023-06-15 – 2023-06-17 (×3): 2 via ORAL
  Filled 2023-06-14 (×3): qty 2

## 2023-06-14 MED ORDER — DIPHENHYDRAMINE HCL 25 MG PO CAPS: 25.0000 mg | ORAL_CAPSULE | ORAL | Status: DC | PRN

## 2023-06-14 MED ORDER — METOCLOPRAMIDE HCL 5 MG/ML IJ SOLN
INTRAMUSCULAR | Status: AC
  Filled 2023-06-14: qty 2

## 2023-06-14 SURGICAL SUPPLY — 33 items
APL SKNCLS STERI-STRIP NONHPOA (GAUZE/BANDAGES/DRESSINGS) ×2
BENZOIN TINCTURE PRP APPL 2/3 (GAUZE/BANDAGES/DRESSINGS) IMPLANT
CLAMP UMBILICAL CORD (MISCELLANEOUS) ×1 IMPLANT
CLOTH BEACON ORANGE TIMEOUT ST (SAFETY) ×1 IMPLANT
DRSG OPSITE POSTOP 4X10 (GAUZE/BANDAGES/DRESSINGS) ×1 IMPLANT
ELECT REM PT RETURN 9FT ADLT (ELECTROSURGICAL) ×1
ELECTRODE REM PT RTRN 9FT ADLT (ELECTROSURGICAL) ×1 IMPLANT
EXTRACTOR VACUUM KIWI (MISCELLANEOUS) IMPLANT
GAUZE PAD ABD 7.5X8 STRL (GAUZE/BANDAGES/DRESSINGS) IMPLANT
GAUZE SPONGE 2X2 STRL 8-PLY (GAUZE/BANDAGES/DRESSINGS) IMPLANT
GAUZE SPONGE 4X4 12PLY STRL LF (GAUZE/BANDAGES/DRESSINGS) IMPLANT
GLOVE BIO SURGEON STRL SZ 6 (GLOVE) ×1 IMPLANT
GLOVE BIOGEL PI IND STRL 7.0 (GLOVE) ×2 IMPLANT
GOWN STRL REUS W/TWL LRG LVL3 (GOWN DISPOSABLE) ×2 IMPLANT
KIT ABG SYR 3ML LUER SLIP (SYRINGE) IMPLANT
NDL HYPO 25X5/8 SAFETYGLIDE (NEEDLE) IMPLANT
NEEDLE HYPO 22GX1.5 SAFETY (NEEDLE) IMPLANT
NEEDLE HYPO 25X5/8 SAFETYGLIDE (NEEDLE) ×1 IMPLANT
NS IRRIG 1000ML POUR BTL (IV SOLUTION) ×1 IMPLANT
PACK C SECTION WH (CUSTOM PROCEDURE TRAY) ×1 IMPLANT
PAD OB MATERNITY 4.3X12.25 (PERSONAL CARE ITEMS) ×1 IMPLANT
STRIP CLOSURE SKIN 1/2X4 (GAUZE/BANDAGES/DRESSINGS) IMPLANT
SUT MON AB 4-0 PS1 27 (SUTURE) ×1 IMPLANT
SUT PLAIN 0 NONE (SUTURE) ×1 IMPLANT
SUT VIC AB 0 CT1 27 (SUTURE) ×1
SUT VIC AB 0 CT1 27XBRD ANBCTR (SUTURE) IMPLANT
SUT VIC AB 0 CT1 36 (SUTURE) ×3 IMPLANT
SUT VIC AB 2-0 CT1 27 (SUTURE) ×1
SUT VIC AB 2-0 CT1 TAPERPNT 27 (SUTURE) ×1 IMPLANT
SYR CONTROL 10ML LL (SYRINGE) IMPLANT
TOWEL OR 17X24 6PK STRL BLUE (TOWEL DISPOSABLE) ×1 IMPLANT
TRAY FOLEY W/BAG SLVR 14FR LF (SET/KITS/TRAYS/PACK) IMPLANT
WATER STERILE IRR 1000ML POUR (IV SOLUTION) ×1 IMPLANT

## 2023-06-14 NOTE — Plan of Care (Signed)
  Problem: Education: Goal: Knowledge of Childbirth will improve Outcome: Progressing Goal: Ability to make informed decisions regarding treatment and plan of care will improve Outcome: Progressing Goal: Ability to state and carry out methods to decrease the pain will improve Outcome: Progressing Goal: Individualized Educational Video(s) Outcome: Progressing   Problem: Coping: Goal: Ability to verbalize concerns and feelings about labor and delivery will improve Outcome: Progressing   Problem: Life Cycle: Goal: Ability to make normal progression through stages of labor will improve Outcome: Progressing Goal: Ability to effectively push during vaginal delivery will improve Outcome: Progressing   Problem: Role Relationship: Goal: Will demonstrate positive interactions with the child Outcome: Progressing   Problem: Safety: Goal: Risk of complications during labor and delivery will decrease Outcome: Progressing   Problem: Pain Management: Goal: Relief or control of pain from uterine contractions will improve Outcome: Progressing   Problem: Education: Goal: Knowledge of General Education information will improve Description: Including pain rating scale, medication(s)/side effects and non-pharmacologic comfort measures Outcome: Progressing   Problem: Health Behavior/Discharge Planning: Goal: Ability to manage health-related needs will improve Outcome: Progressing   Problem: Clinical Measurements: Goal: Ability to maintain clinical measurements within normal limits will improve Outcome: Progressing Goal: Will remain free from infection Outcome: Progressing Goal: Diagnostic test results will improve Outcome: Progressing Goal: Respiratory complications will improve Outcome: Progressing Goal: Cardiovascular complication will be avoided Outcome: Progressing   Problem: Activity: Goal: Risk for activity intolerance will decrease Outcome: Progressing   Problem:  Nutrition: Goal: Adequate nutrition will be maintained Outcome: Progressing   Problem: Coping: Goal: Level of anxiety will decrease Outcome: Progressing   Problem: Elimination: Goal: Will not experience complications related to bowel motility Outcome: Progressing Goal: Will not experience complications related to urinary retention Outcome: Progressing   Problem: Pain Managment: Goal: General experience of comfort will improve Outcome: Progressing   Problem: Safety: Goal: Ability to remain free from injury will improve Outcome: Progressing   Problem: Skin Integrity: Goal: Risk for impaired skin integrity will decrease Outcome: Progressing   Problem: Education: Goal: Knowledge of the prescribed therapeutic regimen will improve Outcome: Progressing Goal: Understanding of sexual limitations or changes related to disease process or condition will improve Outcome: Progressing Goal: Individualized Educational Video(s) Outcome: Progressing   Problem: Self-Concept: Goal: Communication of feelings regarding changes in body function or appearance will improve Outcome: Progressing   Problem: Skin Integrity: Goal: Demonstration of wound healing without infection will improve Outcome: Progressing   Problem: Education: Goal: Knowledge of condition will improve Outcome: Progressing Goal: Individualized Educational Video(s) Outcome: Progressing Goal: Individualized Newborn Educational Video(s) Outcome: Progressing   Problem: Activity: Goal: Will verbalize the importance of balancing activity with adequate rest periods Outcome: Progressing Goal: Ability to tolerate increased activity will improve Outcome: Progressing   Problem: Coping: Goal: Ability to identify and utilize available resources and services will improve Outcome: Progressing   Problem: Life Cycle: Goal: Chance of risk for complications during the postpartum period will decrease Outcome: Progressing    Problem: Role Relationship: Goal: Ability to demonstrate positive interaction with newborn will improve Outcome: Progressing   Problem: Skin Integrity: Goal: Demonstration of wound healing without infection will improve Outcome: Progressing

## 2023-06-14 NOTE — Transfer of Care (Signed)
Immediate Anesthesia Transfer of Care Note  Patient: Molly Sims  Procedure(s) Performed: CESAREAN SECTION INTRAUTERINE DEVICE (IUD) INSERTION  Patient Location: PACU  Anesthesia Type:Epidural  Level of Consciousness: awake, alert , and oriented  Airway & Oxygen Therapy: Patient Spontanous Breathing  Post-op Assessment: Report given to RN and Post -op Vital signs reviewed and stable  Post vital signs: Reviewed and stable  Last Vitals:  Vitals Value Taken Time  BP 104/65 06/14/23 0601  Temp 36.4 C 06/14/23 0559  Pulse 101 06/14/23 0603  Resp 19 06/14/23 0603  SpO2 95 % 06/14/23 0603  Vitals shown include unfiled device data.  Last Pain:  Vitals:   06/14/23 0559  TempSrc: Oral  PainSc:          Complications: No notable events documented.

## 2023-06-14 NOTE — Op Note (Signed)
Molly Sims PROCEDURE DATE: 06/14/2023  PREOPERATIVE DIAGNOSES: Intrauterine pregnancy at [redacted]w[redacted]d weeks gestation; non-reassuring fetal status  POSTOPERATIVE DIAGNOSES: The same  PROCEDURE: Primary Low Transverse Cesarean Section, postplacental IUD insertion  SURGEON:  Dr. Milas Hock  ASSISTANT:  Dr. Gwenyth Allegra. An experienced assistant was required given the standard of surgical care given the complexity of the case.  This assistant was needed for exposure, dissection, suctioning, retraction, instrument exchange, and for overall help during the procedure.  ANESTHESIOLOGY TEAM: Anesthesiologist: Lannie Fields, DO  INDICATIONS: Molly Sims is a 37 y.o. [phone] at [redacted]w[redacted]d here for cesarean section secondary to the indications listed under preoperative diagnoses; please see preoperative note for further details.  The risks of surgery were discussed with the patient including but were not limited to: bleeding which may require transfusion or reoperation; infection which may require antibiotics; injury to bowel, bladder, ureters or other surrounding organs; injury to the fetus; need for additional procedures including hysterectomy in the event of a life-threatening hemorrhage; formation of adhesions; placental abnormalities wth subsequent pregnancies; incisional problems; thromboembolic phenomenon and other postoperative/anesthesia complications.  The patient concurred with the proposed plan, giving informed written consent for the procedure.  While in the OR and prepping the patient, FHT noted to be in 60s so the surgery was converted to an emergency cesarean section.   FINDINGS:  Viable female infant in cephalic presentation.  Apgars 2 and 8.  Amniotic fluid: clear.  Intact placenta, three vessel cord.  Normal uterus, fallopian tubes and ovaries bilaterally.  ANESTHESIA: epidural FLUIDS: 1400 ml ESTIMATED BLOOD LOSS: 153 ml URINE OUTPUT:  25 ml SPECIMENS: Placenta sent to pathology   COMPLICATIONS: None immediate  PROCEDURE IN DETAIL:  The patient preoperatively received intravenous antibiotics and had sequential compression devices applied to her lower extremities.  She was then taken to the operating room where the epidural was dosed up to a surgical level and found to be adequate. She was then placed in a dorsal supine position with a leftward tilt, and prepped and draped in a sterile manner.  A foley catheter was  was already in place.  After an adequate timeout was performed, a Pfannenstiel skin incision was made with scalpel and carried through to the underlying layer of fascia. The fascia was incised in the midline, and this incision was extended bluntly. The rectus muscles were separated in the midline and the peritoneum was entered bluntly.   The Alexis self-retaining retractor was introduced into the abdominal cavity.  Attention was turned to the lower uterine segment where a low transverse hysterotomy was made with a scalpel and extended bilaterally bluntly.  The infant was successfully delivered, the cord was clamped and cut immediately, and the infant was handed over to the awaiting neonatology team. Uterine massage was then administered, and the placenta delivered intact with a three-vessel cord. The uterus was then cleared of clots and debris. Mirena IUD was then inserted into the uterus. The hysterotomy was closed with 0 Vicryl in a running fashion.  3 additional Figure-of-eight 0 Vicryl serosal stitches of the same suture were placed to help with hemostasis.    The pelvis was cleared of all clot and debris. Hemostasis was confirmed on all surfaces.  The retractor was removed.  The peritoneum was closed with a 2-0 Vicryl running stitch. The fascia was then closed using 0 Vicryl in a running fashion.  The subcutaneous layer was irrigated, and was found to be hemostatic, any areas of bleeding were cauterized with  the bovie, and was reapproximated with 2-0 plain gut in a  running fashion. The skin was closed with a 4-0 monocryl subcuticular stitch. The patient tolerated the procedure well. Sponge, instrument and needle counts were correct x 3.  She was taken to the recovery room in stable condition.   Milas Hock, MD Attending Obstetrician & Gynecologist, [facility] for [facility], [facility]

## 2023-06-14 NOTE — Progress Notes (Signed)
Pt remains 7.5/60/-3 by Dr. Rigoberto Noel exam. Pitocin has been attempted multiple times with prolonged decels each time. FHT currently with moderate variability and accels, but contractions are >q7 min. Discussed given inability to provide augmentation due to fetal intolerance and lack of cervical change over 9 hours, we recommend cesarean delivery. She was offered the alternative of continuing present management and trying pitocin a third time. The patient was given to consider her options and she would like to proceed with cesarean delivery. She would like ppIUD placed at the time of c-section.   The risks of surgery were discussed with the patient including but were not limited to: bleeding which may require transfusion or reoperation; infection which may require antibiotics; injury to bowel, bladder, ureters or other surrounding organs; injury to the fetus; need for additional procedures including hysterectomy in the event of a life-threatening hemorrhage; formation of adhesions; placental abnormalities with subsequent pregnancies; incisional problems; thromboembolic phenomenon and other postoperative/anesthesia complications.    The patient concurred with the proposed plan, giving informed written consent for the procedure. Anesthesia and OR aware. Preoperative prophylactic antibiotics and SCDs ordered on call to the OR.  To OR when ready.  Milas Hock, MD Attending Obstetrician & Gynecologist, [facility] for [facility], [facility]

## 2023-06-14 NOTE — Discharge Summary (Signed)
Postpartum Discharge Summary  Date of Service updated: 06/17/23     Patient Name: Molly Sims DOB: Sep 01, 1986 MRN: [id number]  Date of admission: 06/13/2023 Delivery date:06/14/2023 Delivering provider: Milas Hock Date of discharge: 06/17/2023  Admitting diagnosis: Normal labor [O80, Z37.9] Intrauterine pregnancy: [redacted]w[redacted]d     Secondary diagnosis:  Principal Problem:   Status post primary low transverse cesarean section Active Problems:   Obesity in pregnancy   BMI 50.0-59.9, adult (HCC)   History of premature delivery, currently pregnant   Short interval between pregnancies affecting pregnancy, antepartum   Supervision of high risk pregnancy, antepartum   Normal labor   Preeclampsia in postpartum period  Additional problems: NA    Discharge diagnosis: Term Pregnancy Delivered and Preeclampsia (mild)                                              Post partum procedures: NA Augmentation: AROM and Pitocin Complications: recurrent FHR late decelerations in labor  Hospital course: Onset of Labor With Unplanned C/S   37 y.o. yo [id number] at [redacted]w[redacted]d was admitted in Active Labor on 06/13/2023. Patient had a labor course significant for repetitive late FHR decelerations. The patient went for cesarean section due to  fetal intolerance of labor, arrest of dilation . Delivery details as follows: Membrane Rupture Time/Date: 7:15 PM,06/13/2023  Delivery Method:C-Section, Low Transverse Operative Delivery:N/A Details of operation can be found in separate operative note. Patient had an unremarkable course. Her BP was controlled with Procardia and lasix..  She is ambulating,tolerating a regular diet, passing flatus, and urinating well.  Patient is discharged home in stable condition 06/17/23.  Newborn Data: Birth date:06/14/2023 Birth time:5:02 AM Gender:Female Living status:Living Apgars:2 ,8  Weight:2590 g  Magnesium Sulfate received: No BMZ received: No Rhophylac:N/A MMR:N/A T-DaP:Given  prenatally Flu: N/A Transfusion:No  Physical exam  Vitals:   06/16/23 1438 06/16/23 1955 06/16/23 2203 06/17/23 0511  BP: 120/74 119/73 138/79 119/80  Pulse:  78 80 90  Resp:  18 19 20   Temp:  98.3 F (36.8 C) 99 F (37.2 C) 98.6 F (37 C)  TempSrc:  Oral Oral   SpO2:  100% 100% 100%  Weight:      Height:       General: alert Lochia: appropriate Uterine Fundus: firm Incision: Dressing is clean, dry, and intact DVT Evaluation: No evidence of DVT seen on physical exam. Labs: Lab Results  Component Value Date   WBC 11.2 (H) 06/16/2023   HGB 9.5 (L) 06/16/2023   HCT 28.9 (L) 06/16/2023   MCV 87.6 06/16/2023   PLT 304 06/16/2023      Latest Ref Rng & Units 06/16/2023    1:27 PM  CMP  Glucose 70 - 99 mg/dL 96   BUN 6 - 20 mg/dL 7   Creatinine 2.95 - 6.21 mg/dL 3.08   Sodium 657 - 846 mmol/L 139   Potassium 3.5 - 5.1 mmol/L 3.5   Chloride 98 - 111 mmol/L 104   CO2 22 - 32 mmol/L 25   Calcium 8.9 - 10.3 mg/dL 8.6   Total Protein 6.5 - 8.1 g/dL 6.1   Total Bilirubin 0.3 - 1.2 mg/dL 0.2   Alkaline Phos 38 - 126 U/L 75   AST 15 - 41 U/L 14   ALT 0 - 44 U/L 14    Edinburgh Score:  06/14/2023   10:35 AM  Edinburgh Postnatal Depression Scale Screening Tool  I have been able to laugh and see the funny side of things. 0  I have looked forward with enjoyment to things. 0  I have blamed myself unnecessarily when things went wrong. 3  I have been anxious or worried for no good reason. 0  I have felt scared or panicky for no good reason. 0  Things have been getting on top of me. 1  I have been so unhappy that I have had difficulty sleeping. 0  I have felt sad or miserable. 0  I have been so unhappy that I have been crying. 0  The thought of harming myself has occurred to me. 0  Edinburgh Postnatal Depression Scale Total 4     After visit meds:  Allergies as of 06/17/2023   No Known Allergies      Medication List     STOP taking these medications    aspirin EC  81 MG tablet   Blood Pressure Kit Devi   ferrous sulfate 325 (65 FE) MG tablet   imiquimod 5 % cream Commonly known as: ALDARA       TAKE these medications    furosemide 20 MG tablet Commonly known as: LASIX Take 1 tablet (20 mg total) by mouth daily.   ibuprofen 600 MG tablet Commonly known as: ADVIL Take 1 tablet (600 mg total) by mouth every 6 (six) hours.   NIFEdipine 60 MG 24 hr tablet Commonly known as: ADALAT CC Take 1 tablet (60 mg total) by mouth daily.   oxyCODONE 5 MG immediate release tablet Commonly known as: Oxy IR/ROXICODONE Take 1-2 tablets (5-10 mg total) by mouth every 4 (four) hours as needed for moderate pain.   Prenatal Plus Vitamin/Mineral 27-1 MG Tabs Take 1 tablet by mouth daily.         Discharge home in stable condition Infant Feeding: Bottle Infant Disposition:home with mother Discharge instruction: per After Visit Summary and Postpartum booklet. Activity: Advance as tolerated. Pelvic rest for 6 weeks.  Diet: routine diet Future Appointments: Future Appointments  Date Time Provider Department Center  06/21/2023  2:20 PM [facility] NURSE [facility] None  07/27/2023 10:55 AM Adam Phenix, MD [facility] None   Follow up Visit:  Follow-up Information     [facility] Follow up.   Contact information: [address] [place] [address] [phone]                The following message was sent to [facility].  Please schedule this patient for a In person postpartum visit in 6 weeks with the following provider: Any provider. Additional Postpartum F/U:Incision check 1 week  High risk pregnancy complicated by:  obesity, BMI>51; limited prenatal care Delivery mode:  C-Section, Low Transverse Anticipated Birth Control:  PP IUD placed   06/17/2023 Hermina Staggers, MD

## 2023-06-14 NOTE — Consult Note (Signed)
Neonatology Note:   Attendance at C-section:    I was asked by Dr. Para March to attend this C/S delivery at term for decels and bradycardia just prior to delivery. The mother is a 37 y.o. [phone] who is GBS neg with lmited prenatal care complicated by obesity and THC (no UDSs noted).  ROM 9h 45m prior to delivery, fluid clear. Infant not vigorous nor with good spontaneous cry and tone. +60 sec DCC not done.  Immediately brought to warmer and dried and stimulated and suctioned.  HR >100, no resp effort, no tone.  PPV initiated with coughs then gradually stronger cry.  Sao2 initially in 50s and responded well to 90s.  Fio2 adjusted accordingly.  At 2-3 minutes, PPV switched to CPAP and baby continued to improve.  RA trial at 4-5 minutes.  Lungs clear to ausc, good tone and pink in DR.   Apgar 2 @ 1 minute and 8 @ 5 minutes.  Family updated.  To MBU in care of Pediatrician.  Please contact us if further concerns.   Dineen Kid Leary Roca, MD Neonatologist 06/14/2023, 5:17 AM

## 2023-06-14 NOTE — Progress Notes (Addendum)
Labor Progress Note Molly Sims is a 37 y.o. [id number] at [redacted]w[redacted]d presented for spontaneous onset of labor. S: continuing to have intermittent late decels. Pitocin started on 1 unit, difficulty tracing baby on the monitor  O:  BP 124/74   Pulse (!) 103   Temp 98.6 F (37 C) (Oral)   Resp 16   Ht 5\' 1"  (1.549 m)   Wt 126.8 kg   LMP 09/27/2022   SpO2 99%   BMI 52.81 kg/m  EFM: 140 bpm, moderate variability, + accelerations, intermittent late decels decelerations  CVE: Dilation: 7.5 Effacement (%): 60 Cervical Position: Anterior Station: -3 Presentation: Vertex Exam by:: Dr. Ladon Applebaum   [name]: 37 y.o. [id number] [redacted]w[redacted]d admitted with spontaneous onset of labor. #Labor:minimal change in the last few hours, however contractions spaced out and we have been working on fetal decels. IUPC placed, started amnioinfusion with 300cc bolus and 125cc/hr. FSE placed. Continue pitocin augmentation 1 X 1. #FWB: Cat 2 due to intermittent late decels. However overall reassuring with excellent accels and good variability. #GBS negative  Sheppard Evens MD MPH OB Fellow, Faculty Practice [facility], Center for [facility] 06/14/2023

## 2023-06-14 NOTE — Anesthesia Postprocedure Evaluation (Signed)
Anesthesia Post Note  Patient: Molly Sims  Procedure(s) Performed: CESAREAN SECTION INTRAUTERINE DEVICE (IUD) INSERTION     Patient location during evaluation: PACU Anesthesia Type: Epidural Level of consciousness: awake and alert and oriented Pain management: pain level controlled Vital Signs Assessment: post-procedure vital signs reviewed and stable Respiratory status: spontaneous breathing, nonlabored ventilation and respiratory function stable Cardiovascular status: blood pressure returned to baseline and stable Postop Assessment: no headache, no backache, patient able to bend at knees and epidural receding Anesthetic complications: no   No notable events documented.  Last Vitals:  Vitals:   06/14/23 0645 06/14/23 0700  BP: 115/67 108/67  Pulse: 81 77  Resp: 15 16  Temp:    SpO2: 100% 97%    Last Pain:  Vitals:   06/14/23 0700  TempSrc:   PainSc: Asleep   Pain Goal:    LLE Motor Response: Purposeful movement (06/14/23 0700) LLE Sensation: Tingling (06/14/23 0700) RLE Motor Response: Purposeful movement (06/14/23 0700) RLE Sensation: Tingling (06/14/23 0700) L Sensory Level: T10-Umbilical region (06/14/23 0700) R Sensory Level: T10-Umbilical region (06/14/23 0700) Epidural/Spinal Function Cutaneous sensation: Tingles (06/14/23 0700), Patient able to flex knees: Yes (06/14/23 0700), Patient able to lift hips off bed: Yes (06/14/23 0700), Back pain beyond tenderness at insertion site: No (06/14/23 0700), Progressively worsening motor and/or sensory loss: No (06/14/23 0700), Bowel and/or bladder incontinence post epidural: No (06/14/23 0700)  Lannie Fields

## 2023-06-14 NOTE — Progress Notes (Signed)
Called to bedside by RN, due a 5 min prolonged late decel. Position changed and pitocin stopped, with FHR return to baseline. Fetal heart tone continues to have good variability. Discussed concerns for fetal intolerance, and arrest of dilation with patient.  Has had recurrent FHR late decels that have persisted despite multiple intrauterine resuscitation efforts, and limiting our ability to adequately augment labor with pitocin.   I recommended going for a C-section due to fetal intolerance at this time; the other option may be to give some time for baby to recover but try one more time with an even lower dose/rate of pitocin to see if its tolerated better. However, this increases the risk of adverse outcomes, including more stress to the fetus and the risk of developing intrapartum infection from prolonging the labor process, following ROM.  Patient decided to go ahead with a C-section at this time.  The risks of cesarean section discussed with the patient included but were not limited to: bleeding which may require transfusion or reoperation; infection which may require antibiotics; injury to bowel, bladder, ureters or other surrounding organs; injury to the fetus; need for additional procedures including hysterectomy in the event of a life-threatening hemorrhage; placental abnormalities wth subsequent pregnancies, incisional problems, thromboembolic phenomenon and other postoperative/anesthesia complications. The patient concurred with the proposed plan, giving informed written consent for the procedure. Anesthesia and OR aware. She would like a Mirena IUD for birth control in addition. Discussed the risk of expulsion and other potential side effects.   Preoperative prophylactic antibiotics and SCDs ordered on call to the OR. To OR when ready  EFM: 140bpm, moderate variability, +accels, late decels.  Cat 2 strip; however reassuring given spontaneous accels and good variability.   Sheppard Evens  MD MPH OB Fellow, Faculty Practice [facility] for [facility] 06/14/2023

## 2023-06-15 MED ORDER — FERROUS SULFATE 325 (65 FE) MG PO TABS
325.0000 mg | ORAL_TABLET | ORAL | Status: DC
  Administered 2023-06-15 – 2023-06-17 (×2): 325 mg via ORAL
  Filled 2023-06-15 (×2): qty 1

## 2023-06-15 NOTE — Progress Notes (Signed)
POSTPARTUM PROGRESS NOTE  Post Partum Day 1 Subjective:  Molly Sims is a 37 y.o. [id number] [redacted]w[redacted]d s/p pltcs.  No acute events overnight.  Pt denies problems with ambulating, voiding or po intake.  She denies nausea or vomiting.  Pain is well controlled.  She has had flatus. She has not had bowel movement.  Lochia Small.   Objective: Blood pressure (!) 117/56, pulse 70, temperature 98.1 F (36.7 C), resp. rate 18, height 5\' 1"  (1.549 m), weight 126.8 kg, last menstrual period 09/27/2022, SpO2 98%, unknown if currently breastfeeding.  Physical Exam:  General: alert, cooperative and no distress Lochia:normal flow Chest: CTAB Heart: RRR no m/r/g Abdomen: +BS, soft, nontender,  Uterine Fundus: firm, dressing c/d/i DVT Evaluation: No calf swelling or tenderness Extremities: trace LE edema  Recent Labs    06/13/23 1630 06/15/23 0416  HGB 10.1* 8.8*  HCT 30.8* 27.0*    Assessment/Plan:  ASSESSMENT: Molly Sims is a 37 y.o. [id number] [redacted]w[redacted]d s/p pltcs for fetal indications. Hgb 8.8, asymptomatic, will start oral iron. Consented for circ. IUD placed at time of cesarean. Plan for d/c tomorrow.    LOS: 2 days   Silvano Bilis 06/15/2023, 9:07 AM

## 2023-06-15 NOTE — Clinical Social Work Maternal (Signed)
CLINICAL SOCIAL WORK MATERNAL/CHILD NOTE   Patient Details  Name: Molly Sims MRN: [id number] Date of Birth:    Date:  02-Dec-2021   Clinical Social Worker Initiating Note:  Willaim Rayas Amerigo Mcglory      Date/Time: Initiated:  06/15/23/1447      Child's Name:  Molly Sims October 18, 2022    Biological Parents:  Mother Bern Fare )    Need for Interpreter:  None    Reason for Referral:  Late or No Prenatal Care      Address:  [address] [place] [address]    Phone number:  [phone] (home)      Additional phone number:    Household Members/Support Persons (HM/SP):   Household Member/Support Person 1, Household Member/Support Person 2, Household Member/Support Person 3     HM/SP Name Relationship DOB or Age  HM/SP -1 Hunter Bachar son 05/13/2007  HM/SP -2 Dorris Carnes Dolores daughter 08/24/21  HM/SP -3 Onyx Everetts son 08/05/22  HM/SP -4     HM/SP -5     HM/SP -6     HM/SP -7     HM/SP -8         Natural Supports (not living in the home):  Parent, Extended Family    Professional Supports: None    Employment: Full-time    Type of Work: Clinical biochemist    Education:  Research scientist (physical sciences)    Homebound arranged:     Surveyor, quantity Resources:  Medicaid    Other Resources:  Sales executive  , WIC    Cultural/Religious Considerations Which May Impact Care:     Strengths:  Ability to meet basic needs  , Compliance with medical plan  , Home prepared for child  , Pediatrician chosen    Psychotropic Medications:          Pediatrician:    [employer] operational [employer] area   Pediatrician List:    [facility] ([address] [employer])  [place]   [place]       Pediatrician Fax Number:     Risk Factors/Current Problems:  None    Cognitive State:  Able to Concentrate  , Alert      Mood/Affect:  Calm  , Comfortable      CSW Assessment: CSW received consult for  limited parental care. CSW met with MOB to compete assessment and offer support. CSW entered the room and observed MOB up in bed holding the infant. CSW introduced self, CSW role and reason for visit. MOB was agreeable to assessment and remained engaged throughout the assessment. CSW inquired about how MOB was feeling, MOB reported good. CSW confirmed MOB address and phone number, MOB verified the information on file was correct.   CSW inquired about any MH hx, MOB denied any MH concerns. CSW assessed for safety, MOB denied any SI, HI or DV. CSW provided education regarding the baby blues period vs. perinatal mood disorders, discussed treatment and gave resources for mental health follow up if concerns arise.  CSW recommends self-evaluation during the postpartum time period using the New Mom Checklist from Postpartum Progress and encouraged MOB to contact a medical professional if symptoms are noted at any time.  MOB identified her mom, dad and cousin as her supports.    CSW explained the hospital drug screen policy due to limited prenatal care. MOB verbalized understanding. CSW notified  MOB infants UDS was negative and the CDS was  pending, MOB verbalized understanding .  CSW provided review of Sudden Infant Death Syndrome (SIDS) precautions. MOB identified TAPM for infants follow up care. MOB reported she has all necessary items for the infant including a pack n play and car seat.  CSW identifies no further need for intervention and no barriers to discharge at this time.   CSW Plan/Description:  No Further Intervention Required/No Barriers to Discharge, Sudden Infant Death Syndrome (SIDS) Education, Perinatal Mood and Anxiety Disorder (PMADs) Education, CSW Will Continue to Monitor Umbilical Cord Tissue Drug Screen Results and Make Report if [facility] Drug Screen Policy Information      Maud Deed, [place] 2023/02/06, 2:54 PM

## 2023-06-16 DIAGNOSIS — O1495 Unspecified pre-eclampsia, complicating the puerperium: Secondary | ICD-10-CM | POA: Diagnosis not present

## 2023-06-16 DIAGNOSIS — Z8759 Personal history of other complications of pregnancy, childbirth and the puerperium: Secondary | ICD-10-CM | POA: Diagnosis not present

## 2023-06-16 LAB — COMPREHENSIVE METABOLIC PANEL
ALT: 14 U/L (ref 0–44)
AST: 14 U/L — ABNORMAL LOW (ref 15–41)
Albumin: 2.4 g/dL — ABNORMAL LOW (ref 3.5–5.0)
Alkaline Phosphatase: 75 U/L (ref 38–126)
Anion gap: 10 (ref 5–15)
BUN: 7 mg/dL (ref 6–20)
CO2: 25 mmol/L (ref 22–32)
Calcium: 8.6 mg/dL — ABNORMAL LOW (ref 8.9–10.3)
Chloride: 104 mmol/L (ref 98–111)
Creatinine, Ser: 0.76 mg/dL (ref 0.44–1.00)
GFR, Estimated: >60 mL/min (ref >60)
Glucose, Bld: 96 mg/dL (ref 70–99)
Potassium: 3.5 mmol/L (ref 3.5–5.1)
Sodium: 139 mmol/L (ref 135–145)
Total Bilirubin: 0.2 mg/dL — ABNORMAL LOW (ref 0.3–1.2)
Total Protein: 6.1 g/dL — ABNORMAL LOW (ref 6.5–8.1)

## 2023-06-16 LAB — CBC
HCT: 28.9 % — ABNORMAL LOW (ref 36.0–46.0)
Hemoglobin: 9.5 g/dL — ABNORMAL LOW (ref 12.0–15.0)
MCH: 28.8 pg (ref 26.0–34.0)
MCHC: 32.9 g/dL (ref 30.0–36.0)
MCV: 87.6 fL (ref 80.0–100.0)
Platelets: 304 10*3/uL (ref 150–400)
RBC: 3.3 MIL/uL — ABNORMAL LOW (ref 3.87–5.11)
RDW: 15.2 % (ref 11.5–15.5)
WBC: 11.2 10*3/uL — ABNORMAL HIGH (ref 4.0–10.5)
nRBC: 0 % (ref 0.0–0.2)

## 2023-06-16 MED ORDER — IRON SUCROSE 500 MG IVPB - SIMPLE MED
500.0000 mg | Freq: Once | INTRAVENOUS | Status: AC
  Administered 2023-06-16: 500 mg via INTRAVENOUS
  Filled 2023-06-16: qty 275

## 2023-06-16 MED ORDER — FUROSEMIDE 20 MG PO TABS
20.0000 mg | ORAL_TABLET | Freq: Every day | ORAL | Status: DC
  Administered 2023-06-16 – 2023-06-17 (×2): 20 mg via ORAL
  Filled 2023-06-16 (×2): qty 1

## 2023-06-16 MED ORDER — LABETALOL HCL 5 MG/ML IV SOLN: 20.0000 mg | INTRAVENOUS | Status: DC | PRN

## 2023-06-16 MED ORDER — LABETALOL HCL 5 MG/ML IV SOLN: 40.0000 mg | INTRAVENOUS | Status: DC | PRN

## 2023-06-16 MED ORDER — NIFEDIPINE ER OSMOTIC RELEASE 30 MG PO TB24
60.0000 mg | ORAL_TABLET | Freq: Every day | ORAL | Status: DC
  Administered 2023-06-17: 60 mg via ORAL
  Filled 2023-06-16: qty 2

## 2023-06-16 MED ORDER — LABETALOL HCL 5 MG/ML IV SOLN: 80.0000 mg | INTRAVENOUS | Status: DC | PRN

## 2023-06-16 MED ORDER — NIFEDIPINE ER OSMOTIC RELEASE 30 MG PO TB24
30.0000 mg | ORAL_TABLET | Freq: Every day | ORAL | Status: DC
  Administered 2023-06-16: 30 mg via ORAL
  Filled 2023-06-16: qty 1

## 2023-06-16 MED ORDER — HYDRALAZINE HCL 20 MG/ML IJ SOLN: 10.0000 mg | INTRAMUSCULAR | Status: DC | PRN

## 2023-06-16 MED ORDER — NIFEDIPINE ER OSMOTIC RELEASE 30 MG PO TB24
30.0000 mg | ORAL_TABLET | Freq: Once | ORAL | Status: AC
  Administered 2023-06-16: 30 mg via ORAL
  Filled 2023-06-16: qty 1

## 2023-06-16 MED ORDER — LABETALOL HCL 5 MG/ML IV SOLN
INTRAVENOUS | Status: AC
  Administered 2023-06-16: 20 mg via INTRAVENOUS
  Filled 2023-06-16: qty 4

## 2023-06-16 NOTE — Progress Notes (Signed)
Post Partum Day 1 Subjective: no complaints  Objective: Blood pressure (!) 151/95, pulse 87, temperature 97.6 F (36.4 C), temperature source Oral, resp. rate 16, height 5\' 1"  (1.549 m), weight 126.8 kg, last menstrual period 09/27/2022, SpO2 100%, unknown if currently breastfeeding.  Physical Exam:  General: alert Lochia: appropriate Uterine Fundus: firm Incision: NA DVT Evaluation: No evidence of DVT seen on physical exam.  Recent Labs    06/13/23 1630 06/15/23 0416  HGB 10.1* 8.8*  HCT 30.8* 27.0*    Assessment/Plan: Will start BP medication. IV iron. Plan for discharge tomorrow   LOS: 3 days   Hermina Staggers, MD 06/16/2023, 8:01 AM

## 2023-06-17 MED ORDER — FUROSEMIDE 20 MG PO TABS: 20.0000 mg | ORAL_TABLET | Freq: Every day | ORAL | 0 refills | Status: AC

## 2023-06-17 MED ORDER — NIFEDIPINE ER 60 MG PO TB24: 60.0000 mg | ORAL_TABLET | Freq: Every day | ORAL | 1 refills | Status: AC

## 2023-06-17 MED ORDER — IBUPROFEN 600 MG PO TABS: 600.0000 mg | ORAL_TABLET | Freq: Four times a day (QID) | ORAL | 1 refills | Status: AC

## 2023-06-17 MED ORDER — OXYCODONE HCL 5 MG PO TABS: 5.0000 mg | ORAL_TABLET | ORAL | 0 refills | Status: DC | PRN

## 2023-06-17 NOTE — Discharge Instructions (Signed)
WHAT TO LOOK OUT FOR: Fever of 100.4 or above Mastitis: feels like flu and breasts hurt Infection: increased pain, swelling or redness Blood clots golf ball size or larger Postpartum depression   Congratulations on your newest addition!

## 2023-06-20 ENCOUNTER — Ambulatory Visit: Payer: Medicaid Other

## 2023-06-20 ENCOUNTER — Other Ambulatory Visit: Payer: Medicaid Other

## 2023-06-21 ENCOUNTER — Ambulatory Visit: Payer: Medicaid Other | Admitting: Emergency Medicine

## 2023-06-21 ENCOUNTER — Other Ambulatory Visit: Payer: Self-pay | Admitting: Obstetrics and Gynecology

## 2023-06-21 VITALS — BP 131/81 | HR 85 | Ht 62.0 in | Wt 265.0 lb

## 2023-06-21 DIAGNOSIS — Z98891 History of uterine scar from previous surgery: Secondary | ICD-10-CM

## 2023-06-21 DIAGNOSIS — Z0130 Encounter for examination of blood pressure without abnormal findings: Secondary | ICD-10-CM

## 2023-06-21 MED ORDER — OXYCODONE HCL 5 MG PO TABS
5.0000 mg | ORAL_TABLET | ORAL | 0 refills | Status: AC | PRN
Start: 2023-06-21 — End: ?

## 2023-06-21 NOTE — Progress Notes (Signed)
Pt presents for incision check.  The wound is cleansed, debrided of foreign material as much as possible, and dressed. The patient is alerted to watch for any signs of infection (redness, pus, pain, increased swelling or fever) and call if such occurs. Home wound care instructions are provided. Tetanus vaccination status reviewed: last tetanus booster within 10 years.

## 2023-06-21 NOTE — Progress Notes (Signed)
Resend oxycodone due to pharmacy

## 2023-07-09 ENCOUNTER — Other Ambulatory Visit: Payer: Self-pay | Admitting: Obstetrics and Gynecology

## 2023-07-10 NOTE — Telephone Encounter (Signed)
Pt will likely come off this prescription after 30 days, so 30 day supply is appropriate.

## 2023-07-27 ENCOUNTER — Encounter: Payer: Self-pay | Admitting: Obstetrics & Gynecology

## 2023-07-27 ENCOUNTER — Ambulatory Visit (INDEPENDENT_AMBULATORY_CARE_PROVIDER_SITE_OTHER): Payer: Medicaid Other | Admitting: Obstetrics & Gynecology

## 2023-07-27 DIAGNOSIS — T8332XA Displacement of intrauterine contraceptive device, initial encounter: Secondary | ICD-10-CM

## 2023-07-27 DIAGNOSIS — Z3202 Encounter for pregnancy test, result negative: Secondary | ICD-10-CM | POA: Diagnosis not present

## 2023-07-27 DIAGNOSIS — R87810 Cervical high risk human papillomavirus (HPV) DNA test positive: Secondary | ICD-10-CM | POA: Diagnosis not present

## 2023-07-27 DIAGNOSIS — Z30430 Encounter for insertion of intrauterine contraceptive device: Secondary | ICD-10-CM

## 2023-07-27 LAB — POCT URINE PREGNANCY: Preg Test, Ur: NEGATIVE

## 2023-07-27 MED ORDER — LEVONORGESTREL 20 MCG/DAY IU IUD
1.0000 | INTRAUTERINE_SYSTEM | Freq: Once | INTRAUTERINE | Status: AC
Start: 2023-07-27 — End: 2023-07-27
  Administered 2023-07-27: 1 via INTRAUTERINE

## 2023-07-27 NOTE — Progress Notes (Signed)
6 Weeks PP, wants IUD replaced today.  UPT today is Negative     Post Partum Visit Note  Molly Sims is a 37 y.o. [phone] female who presents for a postpartum visit. She is 6 weeks postpartum following a primary cesarean section.  I have fully reviewed the prenatal and intrapartum course. The delivery was at 38.2 gestational weeks.  Anesthesia: epidural. Postpartum course has been unremarkable. Baby is doing well. Baby is feeding by bottle - Similac Neosure. Bleeding moderate lochia. Bowel function is normal. Bladder function is normal. Patient is not sexually active. Contraception method is IUD. Postpartum depression screening: negative.   Upstream - 07/27/23 1110       Pregnancy Intention Screening   Does the patient want to become pregnant in the next year? No    Does the patient's partner want to become pregnant in the next year? No    Would the patient like to discuss contraceptive options today? Yes            The pregnancy intention screening data noted above was reviewed. Potential methods of contraception were discussed. The patient elected to proceed with No data recorded.   Edinburgh Postnatal Depression Scale - 07/27/23 1110       Edinburgh Postnatal Depression Scale:  In the Past 7 Days   I have been able to laugh and see the funny side of things. 0    I have looked forward with enjoyment to things. 0    I have blamed myself unnecessarily when things went wrong. 0    I have been anxious or worried for no good reason. 0    I have felt scared or panicky for no good reason. 0    Things have been getting on top of me. 0    I have been so unhappy that I have had difficulty sleeping. 0    I have felt sad or miserable. 0    I have been so unhappy that I have been crying. 0    The thought of harming myself has occurred to me. 0    Edinburgh Postnatal Depression Scale Total 0             Health Maintenance Due  Topic Date Due   INFLUENZA VACCINE  Never  done   COVID-19 Vaccine (2 - 2023-24 season) 07/15/2023    The following portions of the patient's history were reviewed and updated as appropriate: allergies, current medications, past family history, past medical history, past social history, past surgical history, and problem list.  Review of Systems Pertinent items are noted in HPI.  Objective:  BP 127/84   Pulse 68   Ht 5\' 2"  (1.575 m)   Wt 209 lb (94.8 kg)   LMP 07/27/2023 (Exact Date)   Breastfeeding No   BMI 38.23 kg/m    General:  alert, cooperative, no distress, and morbidly obese   Breasts:  not indicated  Lungs:   Heart:  regular rate and rhythm  Abdomen: soft, non-tender; bowel sounds normal; no masses,  no organomegaly   Wound well approximated incision  GU exam:  normal       Assessment:    1. Postpartum care and examination [Z39.2] IUD replaced today   normal postpartum exam.   Plan:   Essential components of care per ACOG recommendations:  1.  Mood and well being: Patient with negative depression screening today. Reviewed local resources for support.  - Patient tobacco use? No.   - hx  of drug use? No.    2. Infant care and feeding:  -Patient currently breastmilk feeding? No.  -Social determinants of health (SDOH) reviewed in EPIC. No concerns  3. Sexuality, contraception and birth spacing - Patient does not want a pregnancy in the next year.  Desired family size is 4 children.  - Reviewed reproductive life planning. Reviewed contraceptive methods based on pt preferences and effectiveness.  Patient desired IUD or IUS today.   - Discussed birth spacing of 18 months  4. Sleep and fatigue -Encouraged family/partner/community support of 4 hrs of uninterrupted sleep to help with mood and fatigue  5. Physical Recovery  - Discussed patients delivery and complications. She describes her labor as good. - Patient had a C-section.- Patient has urinary incontinence? No. - Patient is safe to resume  physical and sexual activity  6.  Health Maintenance - HM due items addressed Yes - Last pap smear  Diagnosis  Date Value Ref Range Status  04/12/2021   Final   - Negative for intraepithelial lesion or malignancy (NILM)   Pap smear not done at today's visit.  -Breast Cancer screening indicated? No.   7. Chronic Disease/Pregnancy Condition follow up: None  - PCP follow up  Adam Phenix, MD  Center for [facility], [facility]

## 2023-07-27 NOTE — Progress Notes (Signed)
Patient ID: Molly Sims, female   DOB: June 30, 1986, 37 y.o.   MRN: [id number]    GYNECOLOGY OFFICE PROCEDURE NOTE  Molly Sims is a 37 y.o. [id number] here for Mirena IUD insertion. No GYN concerns.  Last pap smear was on 2022 and showed HR HPV.  IUD Insertion Procedure Note Patient identified, informed consent performed, consent signed.   Discussed risks of irregular bleeding, cramping, infection, malpositioning or misplacement of the IUD outside the uterus which Sims require further procedure such as laparoscopy. Time out was performed.  Urine pregnancy test negative.  Speculum placed in the vagina.  Cervix visualized.  Cleaned with Betadine x 2.  Grasped anteriorly with a single tooth tenaculum.  Uterus sounded to 8 cm.  IUD placed per manufacturer's recommendations.  Strings trimmed to 3 cm. Tenaculum was removed, good hemostasis noted.  Patient tolerated procedure well.   Patient was given post-procedure instructions.  She was advised to have backup contraception for one week.  Patient was also asked to check IUD strings periodically and follow up in 4 weeks for IUD check.   Scheryl Darter, MD, FACOG Attending Obstetrician & Gynecologist, [facility] for [facility], [facility]

## 2023-08-03 NOTE — Progress Notes (Signed)
Pt presents for IUD replacement. The previous one fell out in the shower

## 2023-09-12 ENCOUNTER — Ambulatory Visit: Payer: 59 | Admitting: Obstetrics and Gynecology

## 2024-04-03 ENCOUNTER — Other Ambulatory Visit: Payer: Self-pay

## 2024-04-03 DIAGNOSIS — A630 Anogenital (venereal) warts: Secondary | ICD-10-CM

## 2024-04-03 MED ORDER — IMIQUIMOD 5 % EX CREA: TOPICAL_CREAM | CUTANEOUS | 5 refills | Status: AC

## 2024-10-15 ENCOUNTER — Ambulatory Visit: Admitting: Obstetrics and Gynecology

## 2024-11-12 ENCOUNTER — Other Ambulatory Visit (HOSPITAL_COMMUNITY)
Admission: RE | Admit: 2024-11-12 | Discharge: 2024-11-12 | Disposition: A | Source: Ambulatory Visit | Attending: Obstetrics and Gynecology | Admitting: Obstetrics and Gynecology

## 2024-11-12 ENCOUNTER — Ambulatory Visit (INDEPENDENT_AMBULATORY_CARE_PROVIDER_SITE_OTHER): Admitting: Obstetrics and Gynecology

## 2024-11-12 ENCOUNTER — Encounter: Payer: Self-pay | Admitting: Obstetrics and Gynecology

## 2024-11-12 VITALS — BP 137/90 | HR 78 | Ht 62.0 in | Wt 241.4 lb

## 2024-11-12 DIAGNOSIS — Z124 Encounter for screening for malignant neoplasm of cervix: Secondary | ICD-10-CM | POA: Diagnosis present

## 2024-11-12 DIAGNOSIS — Z01419 Encounter for gynecological examination (general) (routine) without abnormal findings: Secondary | ICD-10-CM | POA: Diagnosis not present

## 2024-11-12 DIAGNOSIS — A630 Anogenital (venereal) warts: Secondary | ICD-10-CM

## 2024-11-12 DIAGNOSIS — Z30431 Encounter for routine checking of intrauterine contraceptive device: Secondary | ICD-10-CM

## 2024-11-12 DIAGNOSIS — Z975 Presence of (intrauterine) contraceptive device: Secondary | ICD-10-CM | POA: Insufficient documentation

## 2024-11-12 NOTE — Progress Notes (Signed)
 Pt presents for AEX Last PAP 2022, +HPV Mirena  for [facility]  Declined STD testing

## 2024-11-12 NOTE — Progress Notes (Signed)
 "  ANNUAL EXAM Patient name: Molly Sims MRN [id number]  Date of birth: 12-Dec-1985 Chief Complaint:   Gynecologic Exam  History of Present Illness:   Molly Sims is a 37 y.o. [phone]  female being seen today for a routine annual exam.  Current complaints:  reports hx of genital warts, has aldara  that she has been using, this dx has caused her some concern She has an IUD for birth control that she reports no complaints.    Gynecologic History Patient's last menstrual period was . Contraception:  Last Pap: 04/12/21 NILM HPV + Last mammogram: n/a     11/12/2024    3:49 PM 01/11/2023    8:59 AM 02/23/2022    8:27 AM 03/14/2021    1:30 PM  Depression screen PHQ 2/9  Decreased Interest 0 0 0 0  Down, Depressed, Hopeless 0 0 0 0  PHQ - 2 Score 0 0 0 0  Altered sleeping 0 1 0 0  Tired, decreased energy 0 1 0 0  Change in appetite 0 0 0 0  Feeling bad or failure about yourself  0 0 0 0  Trouble concentrating 0 0 0 0  Moving slowly or fidgety/restless 0 0 0 0  Suicidal thoughts 0 0 0 0  PHQ-9 Score 0 2  0  0      Data saved with a previous flowsheet row definition        11/12/2024    3:49 PM 01/11/2023    9:01 AM 02/23/2022    8:28 AM 03/14/2021    1:30 PM  GAD 7 : Generalized Anxiety Score  Nervous, Anxious, on Edge 0 0 0 0  Control/stop worrying 0 0 0 0  Worry too much - different things 0 1 0 0  Trouble relaxing 0 1 0 0  Restless 0 0 0 0  Easily annoyed or irritable 0 0 0 0  Afraid - awful might happen 0 0 0 0  Total GAD 7 Score 0 2 0 0     Review of Systems:   Pertinent items are noted in HPI Denies any headaches, blurred vision, fatigue, shortness of breath, chest pain, abdominal pain, abnormal vaginal discharge/itching/odor/irritation, problems with periods, bowel movements, urination, or intercourse unless otherwise stated above. Pertinent History Reviewed:  Reviewed past medical,surgical, social and family history.  Reviewed problem list,  medications and allergies. Physical Assessment:   Vitals:   11/12/24 1548 11/12/24 1555  BP: (!) 133/91 (!) 137/90  Pulse: 70 78  Weight: 241 lb 6.4 oz (109.5 kg)   Height: 5' 2 (1.575 m)   Body mass index is 44.15 kg/m.        Physical Examination:   General appearance - well appearing, and in no distress  Mental status - alert, oriented   Psych:  She has a normal mood and affect  Skin - warm and dry, normal color  Chest - effort normal, all lung fields clear to auscultation bilaterally  Heart - normal rate and regular rhythm  Neck:  midline trachea  Breasts - deferred  Abdomen - nondistended  Pelvic - VULVA: normal appearing vulva with no masses, tenderness or lesions  VAGINA: normal appearing vagina with normal color and discharge, no lesions  CERVIX: normal appearing cervix without discharge or lesions, no CMT  Thin prep pap is done w HR HPV cotesting  IUD strings visualized  Extremities:  No swelling or varicosities noted  Chaperone present for exam  No results found for this  or any previous visit (from the past 24 hours).  Assessment & Plan:  1. Encounter for annual routine gynecological examination (Primary) Pap today Mammogram at 41, encouraged self breat awareness   2. Cervical cancer screening Pap completed today, follow up pending results  - Cytology - PAP( [place])  3. IUD check up 07/27/2023- expires 2032 unless desires removal sooner  4. Condyloma acuminatum of vulva Discussion about hpv, reassured common finding she is not alone in dx, no current outbreak. Has aldara  if needed Also discussed vaginal health and supportive measures   Message sent separately regarding HPV vaccine, will follow up    Labs/procedures today:   Mammogram: @ 37yo, or sooner if problems Colonoscopy: @ 37yo, or sooner if problems  No orders of the defined types were placed in this encounter.   Meds: No orders of the defined types were placed in this  encounter.   Follow-up: Return in about 1 year (around 11/12/2025) for [place] Nidia Delores, FNP   "

## 2024-11-20 ENCOUNTER — Ambulatory Visit: Payer: Self-pay | Admitting: Obstetrics and Gynecology

## 2024-11-20 LAB — CYTOLOGY - PAP
Adequacy: ABSENT
Comment: NEGATIVE
Diagnosis: NEGATIVE
High risk HPV: NEGATIVE
